# Patient Record
Sex: Female | Born: 1960
Health system: Southern US, Community
[De-identification: ages and names within clinical notes are randomized; demographics above are authoritative.]

## PROBLEM LIST (undated history)

## (undated) DIAGNOSIS — Z8619 Personal history of other infectious and parasitic diseases: Secondary | ICD-10-CM

## (undated) DIAGNOSIS — Z86010 Personal history of colonic polyps: Principal | ICD-10-CM

## (undated) DIAGNOSIS — I714 Abdominal aortic aneurysm, without rupture, unspecified: Secondary | ICD-10-CM

## (undated) DIAGNOSIS — Z806 Family history of leukemia: Secondary | ICD-10-CM

## (undated) DIAGNOSIS — M722 Plantar fascial fibromatosis: Secondary | ICD-10-CM

## (undated) DIAGNOSIS — T7840XA Allergy, unspecified, initial encounter: Secondary | ICD-10-CM

## (undated) DIAGNOSIS — J45909 Unspecified asthma, uncomplicated: Secondary | ICD-10-CM

## (undated) DIAGNOSIS — I10 Essential (primary) hypertension: Secondary | ICD-10-CM

## (undated) HISTORY — DX: Essential (primary) hypertension: I10

## (undated) HISTORY — DX: Unspecified asthma, uncomplicated: J45.909

## (undated) HISTORY — DX: Family history of leukemia: Z80.6

## (undated) HISTORY — DX: Personal history of colonic polyps: Z86.010

## (undated) HISTORY — PX: COLONOSCOPY: SHX174

## (undated) HISTORY — DX: Plantar fascial fibromatosis: M72.2

## (undated) HISTORY — PX: WISDOM TOOTH EXTRACTION: SHX21

## (undated) HISTORY — DX: Abdominal aortic aneurysm, without rupture, unspecified: I71.40

## (undated) HISTORY — DX: Personal history of other infectious and parasitic diseases: Z86.19

## (undated) HISTORY — DX: Allergy, unspecified, initial encounter: T78.40XA

---

## 2000-11-02 ENCOUNTER — Other Ambulatory Visit: Admission: RE | Admit: 2000-11-02 | Discharge: 2000-11-02 | Payer: Self-pay | Admitting: Gynecology

## 2001-11-03 ENCOUNTER — Other Ambulatory Visit: Admission: RE | Admit: 2001-11-03 | Discharge: 2001-11-03 | Payer: Self-pay | Admitting: Gynecology

## 2002-12-14 ENCOUNTER — Other Ambulatory Visit: Admission: RE | Admit: 2002-12-14 | Discharge: 2002-12-14 | Payer: Self-pay | Admitting: Gynecology

## 2004-01-28 ENCOUNTER — Other Ambulatory Visit: Admission: RE | Admit: 2004-01-28 | Discharge: 2004-01-28 | Payer: Self-pay | Admitting: Gynecology

## 2004-08-26 ENCOUNTER — Other Ambulatory Visit: Admission: RE | Admit: 2004-08-26 | Discharge: 2004-08-26 | Payer: Self-pay | Admitting: Gynecology

## 2005-04-09 ENCOUNTER — Other Ambulatory Visit: Admission: RE | Admit: 2005-04-09 | Discharge: 2005-04-09 | Payer: Self-pay | Admitting: Gynecology

## 2006-05-03 ENCOUNTER — Other Ambulatory Visit: Admission: RE | Admit: 2006-05-03 | Discharge: 2006-05-03 | Payer: Self-pay | Admitting: Gynecology

## 2006-11-23 ENCOUNTER — Other Ambulatory Visit: Admission: RE | Admit: 2006-11-23 | Discharge: 2006-11-23 | Payer: Self-pay | Admitting: Gynecology

## 2007-06-09 ENCOUNTER — Other Ambulatory Visit: Admission: RE | Admit: 2007-06-09 | Discharge: 2007-06-09 | Payer: Self-pay | Admitting: Gynecology

## 2010-09-10 ENCOUNTER — Emergency Department (HOSPITAL_COMMUNITY): Admission: EM | Admit: 2010-09-10 | Discharge: 2010-09-10 | Payer: Self-pay | Admitting: Emergency Medicine

## 2011-04-19 ENCOUNTER — Emergency Department (HOSPITAL_COMMUNITY): Payer: BC Managed Care – PPO

## 2011-04-19 ENCOUNTER — Emergency Department (HOSPITAL_COMMUNITY)
Admission: EM | Admit: 2011-04-19 | Discharge: 2011-04-19 | Disposition: A | Discharge status: Home / Self Care | Payer: BC Managed Care – PPO | Attending: Emergency Medicine | Admitting: Emergency Medicine

## 2011-04-19 DIAGNOSIS — S61409A Unspecified open wound of unspecified hand, initial encounter: Secondary | ICD-10-CM | POA: Insufficient documentation

## 2014-06-21 ENCOUNTER — Other Ambulatory Visit: Payer: Self-pay | Admitting: Emergency Medicine

## 2014-06-21 DIAGNOSIS — R945 Abnormal results of liver function studies: Principal | ICD-10-CM

## 2014-06-21 DIAGNOSIS — R7989 Other specified abnormal findings of blood chemistry: Secondary | ICD-10-CM

## 2014-06-28 ENCOUNTER — Other Ambulatory Visit: Payer: BC Managed Care – PPO

## 2014-07-06 ENCOUNTER — Ambulatory Visit
Admission: RE | Admit: 2014-07-06 | Discharge: 2014-07-06 | Disposition: A | Discharge status: Home / Self Care | Payer: BC Managed Care – PPO | Source: Ambulatory Visit | Attending: Emergency Medicine | Admitting: Emergency Medicine

## 2014-07-06 DIAGNOSIS — R7989 Other specified abnormal findings of blood chemistry: Secondary | ICD-10-CM

## 2014-07-06 DIAGNOSIS — R945 Abnormal results of liver function studies: Principal | ICD-10-CM

## 2015-09-10 ENCOUNTER — Encounter: Payer: Self-pay | Admitting: Internal Medicine

## 2015-11-05 ENCOUNTER — Ambulatory Visit (AMBULATORY_SURGERY_CENTER): Payer: Self-pay | Admitting: *Deleted

## 2015-11-05 VITALS — Ht 64.0 in | Wt 192.6 lb

## 2015-11-05 DIAGNOSIS — Z1211 Encounter for screening for malignant neoplasm of colon: Secondary | ICD-10-CM

## 2015-11-05 NOTE — Progress Notes (Signed)
No egg or soy allergy known to patient  No issues with past sedation with any surgeries  or procedures, no intubation problems  No diet pills No home 02 use per patient   emmi video declined

## 2015-11-22 ENCOUNTER — Encounter: Payer: Self-pay | Admitting: Internal Medicine

## 2015-11-22 ENCOUNTER — Ambulatory Visit (AMBULATORY_SURGERY_CENTER): Payer: BLUE CROSS/BLUE SHIELD | Admitting: Internal Medicine

## 2015-11-22 VITALS — BP 145/82 | HR 57 | Temp 97.9°F | Resp 14 | Ht 64.0 in | Wt 192.0 lb

## 2015-11-22 DIAGNOSIS — D128 Benign neoplasm of rectum: Secondary | ICD-10-CM

## 2015-11-22 DIAGNOSIS — D125 Benign neoplasm of sigmoid colon: Secondary | ICD-10-CM | POA: Diagnosis not present

## 2015-11-22 DIAGNOSIS — Z1211 Encounter for screening for malignant neoplasm of colon: Secondary | ICD-10-CM

## 2015-11-22 DIAGNOSIS — D129 Benign neoplasm of anus and anal canal: Secondary | ICD-10-CM

## 2015-11-22 MED ORDER — SODIUM CHLORIDE 0.9 % IV SOLN: 500.0000 mL | INTRAVENOUS | Status: DC

## 2015-11-22 NOTE — Progress Notes (Signed)
Stable to RR 

## 2015-11-22 NOTE — Op Note (Signed)
[facility]  [address] [place], [address]   COLONOSCOPY PROCEDURE REPORT  PATIENT: Tanya [name], Tanya [name]  MR#: [id number] BIRTHDATE: [birth year] , 74  yrs. old GENDER: female ENDOSCOPIST: Gatha Mayer, MD, [facility] PROCEDURE DATE:  11/22/2015 PROCEDURE:   Colonoscopy, screening and Colonoscopy with snare polypectomy First Screening Colonoscopy - Avg.  risk and is 50 yrs.  old or older Yes.  Prior Negative Screening - Now for repeat screening. N/A  History of Adenoma - Now for follow-up colonoscopy & has been > or = to 3 yrs.  N/A  Polyps removed today? Yes ASA CLASS:   Class I INDICATIONS:Screening for colonic neoplasia and Colorectal Neoplasm Risk Assessment for this procedure is average risk. MEDICATIONS: Propofol 300 mg IV, Monitored anesthesia care, and Lidocaine 40 mg IV  DESCRIPTION OF PROCEDURE:   After the risks benefits and alternatives of the procedure were thoroughly explained, informed consent was obtained.  The digital rectal exam revealed no abnormalities of the rectum.   The LB [id number]  endoscope was introduced through the anus and advanced to the cecum, which was identified by both the appendix and ileocecal valve. No adverse events experienced.   The quality of the prep was excellent. (MiraLax was used)  The instrument was then slowly withdrawn as the colon was fully examined. Estimated blood loss is zero unless otherwise noted in this procedure report.      COLON FINDINGS: A polypoid shaped semi-pedunculated polyp measuring 10 mm in size was found in the sigmoid colon.  A polypectomy was performed using snare cautery.  The resection was complete, the polyp tissue was completely retrieved and sent to histology. Retroflexed views revealed no abnormalities. The time to cecum = 1.5 Withdrawal time = 7.8   The scope was withdrawn and the procedure completed. COMPLICATIONS: There were no immediate complications.  ENDOSCOPIC  IMPRESSION: Semi-pedunculated polyp was found in the sigmoid colon; polypectomy was performed using snare cautery  RECOMMENDATIONS: 1.  Hold Aspirin and all other NSAIDS for 2 weeks. 2.  Timing of repeat colonoscopy will be determined by pathology findings.  eSigned:  Gatha Mayer, MD, [facility] 11/22/2015 8:47 AM   cc: The Patient and [facility]

## 2015-11-22 NOTE — Progress Notes (Signed)
Called to room to assist during endoscopic procedure.  Patient ID and intended procedure confirmed with present staff. Received instructions for my participation in the procedure from the performing physician.  

## 2015-11-22 NOTE — Patient Instructions (Addendum)
I found and removed one polyp - it looks benign. I will let you know pathology results and when to have another routine colonoscopy by mail.  I appreciate the opportunity to care for you. Gatha Mayer, MD, [facility]   Discharge instructions give. Handout on polyps. Hold Aspirin and all other NSAIDS for 2 weeks. Resume previous medications. YOU HAD AN ENDOSCOPIC PROCEDURE TODAY AT [facility]:   Refer to the procedure report that was given to you for any specific questions about what was found during the examination.  If the procedure report does not answer your questions, please call your gastroenterologist to clarify.  If you requested that your care partner not be given the details of your procedure findings, then the procedure report has been included in a sealed envelope for you to review at your convenience later.  YOU SHOULD EXPECT: Some feelings of bloating in the abdomen. Passage of more gas than usual.  Walking can help get rid of the air that was put into your GI tract during the procedure and reduce the bloating. If you had a lower endoscopy (such as a colonoscopy or flexible sigmoidoscopy) you may notice spotting of blood in your stool or on the toilet paper. If you underwent a bowel prep for your procedure, you may not have a normal bowel movement for a few days.  Please Note:  You might notice some irritation and congestion in your nose or some drainage.  This is from the oxygen used during your procedure.  There is no need for concern and it should clear up in a day or so.  SYMPTOMS TO REPORT IMMEDIATELY:   Following lower endoscopy (colonoscopy or flexible sigmoidoscopy):  Excessive amounts of blood in the stool  Significant tenderness or worsening of abdominal pains  Swelling of the abdomen that is new, acute  Fever of 100F or higher   For urgent or emergent issues, a gastroenterologist can be reached at any hour by calling [phone]  [phone].   DIET: Your first meal following the procedure should be a small meal and then it is ok to progress to your normal diet. Heavy or fried foods are harder to digest and may make you feel nauseous or bloated.  Likewise, meals heavy in dairy and vegetables can increase bloating.  Drink plenty of fluids but you should avoid alcoholic beverages for 24 hours.  ACTIVITY:  You should plan to take it easy for the rest of today and you should NOT DRIVE or use heavy machinery until tomorrow (because of the sedation medicines used during the test).    FOLLOW UP: Our staff will call the number listed on your records the next business day following your procedure to check on you and address any questions or concerns that you may have regarding the information given to you following your procedure. If we do not reach you, we will leave a message.  However, if you are feeling well and you are not experiencing any problems, there is no need to return our call.  We will assume that you have returned to your regular daily activities without incident.  If any biopsies were taken you will be contacted by phone or by letter within the next 1-3 weeks.  Please call us at [phone] if you have not heard about the biopsies in 3 weeks.    SIGNATURES/CONFIDENTIALITY: You and/or your care partner have signed paperwork which will be entered into your electronic medical record.  These signatures  attest to the fact that that the information above on your After Visit Summary has been reviewed and is understood.  Full responsibility of the confidentiality of this discharge information lies with you and/or your care-partner.

## 2015-11-26 ENCOUNTER — Telehealth: Payer: Self-pay | Admitting: *Deleted

## 2015-11-26 NOTE — Telephone Encounter (Signed)
  Follow up Call-  Call back number 11/22/2015  Post procedure Call Back phone  # [phone]  Permission to leave phone message Yes    Pt driving, spoke with husband Patient questions:  Do you have a fever, pain , or abdominal swelling? No. Pain Score  0 *  Have you tolerated food without any problems? Yes.    Have you been able to return to your normal activities? Yes.    Do you have any questions about your discharge instructions: Diet   No. Medications  No. Follow up visit  No.  Do you have questions or concerns about your Care? No.  Actions: * If pain score is 4 or above: No action needed, pain <4.

## 2015-11-27 ENCOUNTER — Encounter: Payer: Self-pay | Admitting: Internal Medicine

## 2015-11-27 DIAGNOSIS — Z86010 Personal history of colonic polyps: Secondary | ICD-10-CM

## 2015-11-27 DIAGNOSIS — Z860101 Personal history of adenomatous and serrated colon polyps: Secondary | ICD-10-CM | POA: Insufficient documentation

## 2015-11-27 HISTORY — DX: Personal history of colonic polyps: Z86.010

## 2015-11-27 HISTORY — DX: Personal history of adenomatous and serrated colon polyps: Z86.0101

## 2015-11-27 NOTE — Progress Notes (Signed)
Quick Note:  10 mm adenoma Recall colonoscopy 2020 ______

## 2016-11-25 DIAGNOSIS — H524 Presbyopia: Secondary | ICD-10-CM | POA: Diagnosis not present

## 2017-08-24 ENCOUNTER — Encounter: Payer: Self-pay | Admitting: Physician Assistant

## 2017-08-24 ENCOUNTER — Ambulatory Visit (INDEPENDENT_AMBULATORY_CARE_PROVIDER_SITE_OTHER): Payer: BLUE CROSS/BLUE SHIELD | Admitting: Physician Assistant

## 2017-08-24 VITALS — BP 142/90 | HR 59 | Temp 98.3°F | Resp 14 | Ht 64.0 in | Wt 197.0 lb

## 2017-08-24 DIAGNOSIS — Z0001 Encounter for general adult medical examination with abnormal findings: Secondary | ICD-10-CM

## 2017-08-24 DIAGNOSIS — Z1159 Encounter for screening for other viral diseases: Secondary | ICD-10-CM | POA: Diagnosis not present

## 2017-08-24 DIAGNOSIS — Z0000 Encounter for general adult medical examination without abnormal findings: Secondary | ICD-10-CM

## 2017-08-24 DIAGNOSIS — N951 Menopausal and female climacteric states: Secondary | ICD-10-CM | POA: Insufficient documentation

## 2017-08-24 DIAGNOSIS — R030 Elevated blood-pressure reading, without diagnosis of hypertension: Secondary | ICD-10-CM

## 2017-08-24 DIAGNOSIS — Z6833 Body mass index (BMI) 33.0-33.9, adult: Secondary | ICD-10-CM | POA: Insufficient documentation

## 2017-08-24 DIAGNOSIS — I10 Essential (primary) hypertension: Secondary | ICD-10-CM | POA: Insufficient documentation

## 2017-08-24 DIAGNOSIS — E669 Obesity, unspecified: Secondary | ICD-10-CM | POA: Insufficient documentation

## 2017-08-24 DIAGNOSIS — R87619 Unspecified abnormal cytological findings in specimens from cervix uteri: Secondary | ICD-10-CM | POA: Insufficient documentation

## 2017-08-24 LAB — COMPREHENSIVE METABOLIC PANEL
ALK PHOS: 73 U/L (ref 39–117)
ALT: 63 U/L — AB (ref 0–35)
AST: 31 U/L (ref 0–37)
Albumin: 4.6 g/dL (ref 3.5–5.2)
BILIRUBIN TOTAL: 0.6 mg/dL (ref 0.2–1.2)
BUN: 21 mg/dL (ref 6–23)
CALCIUM: 10.2 mg/dL (ref 8.4–10.5)
CO2: 28 mEq/L (ref 19–32)
Chloride: 101 mEq/L (ref 96–112)
Creatinine, Ser: 0.86 mg/dL (ref 0.40–1.20)
GFR: 72.63 mL/min (ref >60.00)
Glucose, Bld: 123 mg/dL — ABNORMAL HIGH (ref 70–99)
Potassium: 4.2 mEq/L (ref 3.5–5.1)
Sodium: 137 mEq/L (ref 135–145)
TOTAL PROTEIN: 7.1 g/dL (ref 6.0–8.3)

## 2017-08-24 LAB — LIPID PANEL
CHOLESTEROL: 231 mg/dL — AB (ref 0–200)
HDL: 65.8 mg/dL (ref >39.00)
NonHDL: 164.96
TRIGLYCERIDES: 216 mg/dL — AB (ref 0.0–149.0)
Total CHOL/HDL Ratio: 4
VLDL: 43.2 mg/dL — ABNORMAL HIGH (ref 0.0–40.0)

## 2017-08-24 LAB — CBC WITH DIFFERENTIAL/PLATELET
BASOS ABS: 0 10*3/uL (ref 0.0–0.1)
Basophils Relative: 0.6 % (ref 0.0–3.0)
Eosinophils Absolute: 0 10*3/uL (ref 0.0–0.7)
Eosinophils Relative: 0.3 % (ref 0.0–5.0)
HEMATOCRIT: 42.1 % (ref 36.0–46.0)
Hemoglobin: 14.2 g/dL (ref 12.0–15.0)
LYMPHS PCT: 32.7 % (ref 12.0–46.0)
Lymphs Abs: 1.6 10*3/uL (ref 0.7–4.0)
MCHC: 33.7 g/dL (ref 30.0–36.0)
MCV: 92.8 fl (ref 78.0–100.0)
MONOS PCT: 8.8 % (ref 3.0–12.0)
Monocytes Absolute: 0.4 10*3/uL (ref 0.1–1.0)
NEUTROS ABS: 2.8 10*3/uL (ref 1.4–7.7)
Neutrophils Relative %: 57.6 % (ref 43.0–77.0)
PLATELETS: 242 10*3/uL (ref 150.0–400.0)
RBC: 4.54 Mil/uL (ref 3.87–5.11)
RDW: 12.9 % (ref 11.5–15.5)
WBC: 4.8 10*3/uL (ref 4.0–10.5)

## 2017-08-24 LAB — LDL CHOLESTEROL, DIRECT: Direct LDL: 138 mg/dL

## 2017-08-24 LAB — HEMOGLOBIN A1C: Hgb A1c MFr Bld: 6.1 % (ref 4.6–6.5)

## 2017-08-24 NOTE — Assessment & Plan Note (Signed)
Depression screen negative. Health Maintenance reviewed -- Declines flu shot. Is unsure of Tetanus. Will check records. Mammogram scheduled. PAP and colonoscopy up-to-date. Preventive schedule discussed and handout given in AVS. Will obtain fasting labs today.

## 2017-08-24 NOTE — Patient Instructions (Signed)
Please go to the lab for blood work.   Our office will call you with your results unless you have chosen to receive results via MyChart.  If your blood work is normal we will follow-up each year for physicals and as scheduled for chronic medical problems.  If anything is abnormal we will treat accordingly and get you in for a follow-up.  Please start the diet below to help with BP. Follow-up in 1 month for BP recheck with me.   DASH Eating Plan DASH stands for "Dietary Approaches to Stop Hypertension." The DASH eating plan is a healthy eating plan that has been shown to reduce high blood pressure (hypertension). It may also reduce your risk for type 2 diabetes, heart disease, and stroke. The DASH eating plan may also help with weight loss. What are tips for following this plan? General guidelines  Avoid eating more than 2,300 mg (milligrams) of salt (sodium) a day. If you have hypertension, you may need to reduce your sodium intake to 1,500 mg a day.  Limit alcohol intake to no more than 1 drink a day for nonpregnant women and 2 drinks a day for men. One drink equals 12 oz of beer, 5 oz of wine, or 1 oz of hard liquor.  Work with your health care provider to maintain a healthy body weight or to lose weight. Ask what an ideal weight is for you.  Get at least 30 minutes of exercise that causes your heart to beat faster (aerobic exercise) most days of the week. Activities may include walking, swimming, or biking.  Work with your health care provider or diet and nutrition specialist (dietitian) to adjust your eating plan to your individual calorie needs. Reading food labels  Check food labels for the amount of sodium per serving. Choose foods with less than 5 percent of the Daily Value of sodium. Generally, foods with less than 300 mg of sodium per serving fit into this eating plan.  To find whole grains, look for the word "whole" as the first word in the ingredient list. Shopping  Buy  products labeled as "low-sodium" or "no salt added."  Buy fresh foods. Avoid canned foods and premade or frozen meals. Cooking  Avoid adding salt when cooking. Use salt-free seasonings or herbs instead of table salt or sea salt. Check with your health care provider or pharmacist before using salt substitutes.  Do not fry foods. Cook foods using healthy methods such as baking, boiling, grilling, and broiling instead.  Cook with heart-healthy oils, such as olive, canola, soybean, or sunflower oil. Meal planning   Eat a balanced diet that includes: ? 5 or more servings of fruits and vegetables each day. At each meal, try to fill half of your plate with fruits and vegetables. ? Up to 6-8 servings of whole grains each day. ? Less than 6 oz of lean meat, poultry, or fish each day. A 3-oz serving of meat is about the same size as a deck of cards. One egg equals 1 oz. ? 2 servings of low-fat dairy each day. ? A serving of nuts, seeds, or beans 5 times each week. ? Heart-healthy fats. Healthy fats called Omega-3 fatty acids are found in foods such as flaxseeds and coldwater fish, like sardines, salmon, and mackerel.  Limit how much you eat of the following: ? Canned or prepackaged foods. ? Food that is high in trans fat, such as fried foods. ? Food that is high in saturated fat, such as fatty  meat. ? Sweets, desserts, sugary drinks, and other foods with added sugar. ? Full-fat dairy products.  Do not salt foods before eating.  Try to eat at least 2 vegetarian meals each week.  Eat more home-cooked food and less restaurant, buffet, and fast food.  When eating at a restaurant, ask that your food be prepared with less salt or no salt, if possible. What foods are recommended? The items listed may not be a complete list. Talk with your dietitian about what dietary choices are best for you. Grains Whole-grain or whole-wheat bread. Whole-grain or whole-wheat pasta. Brown rice. [place].  Bulgur. Whole-grain and low-sodium cereals. Pita bread. Low-fat, low-sodium crackers. Whole-wheat flour tortillas. Vegetables Fresh or frozen vegetables (raw, steamed, roasted, or grilled). Low-sodium or reduced-sodium tomato and vegetable juice. Low-sodium or reduced-sodium tomato sauce and tomato paste. Low-sodium or reduced-sodium canned vegetables. Fruits All fresh, dried, or frozen fruit. Canned fruit in natural juice (without added sugar). Meat and other protein foods Skinless chicken or [place]. Ground chicken or [place]. Pork with fat trimmed off. Fish and seafood. Egg whites. Dried beans, peas, or lentils. Unsalted nuts, nut butters, and seeds. Unsalted canned beans. Lean cuts of beef with fat trimmed off. Low-sodium, lean deli meat. Dairy Low-fat (1%) or fat-free (skim) milk. Fat-free, low-fat, or reduced-fat cheeses. Nonfat, low-sodium ricotta or cottage cheese. Low-fat or nonfat yogurt. Low-fat, low-sodium cheese. Fats and oils Soft margarine without trans fats. Vegetable oil. Low-fat, reduced-fat, or light mayonnaise and salad dressings (reduced-sodium). Canola, safflower, olive, soybean, and sunflower oils. Avocado. Seasoning and other foods Herbs. Spices. Seasoning mixes without salt. Unsalted popcorn and pretzels. Fat-free sweets. What foods are not recommended? The items listed may not be a complete list. Talk with your dietitian about what dietary choices are best for you. Grains Baked goods made with fat, such as croissants, muffins, or some breads. Dry pasta or rice meal packs. Vegetables Creamed or fried vegetables. Vegetables in a cheese sauce. Regular canned vegetables (not low-sodium or reduced-sodium). Regular canned tomato sauce and paste (not low-sodium or reduced-sodium). Regular tomato and vegetable juice (not low-sodium or reduced-sodium). Angie Fava. Olives. Fruits Canned fruit in a light or heavy syrup. Fried fruit. Fruit in cream or butter sauce. Meat and other  protein foods Fatty cuts of meat. Ribs. Fried meat. Berniece Salines. Sausage. Bologna and other processed lunch meats. Salami. Fatback. Hotdogs. Bratwurst. Salted nuts and seeds. Canned beans with added salt. Canned or smoked fish. Whole eggs or egg yolks. Chicken or [place] with skin. Dairy Whole or 2% milk, cream, and half-and-half. Whole or full-fat cream cheese. Whole-fat or sweetened yogurt. Full-fat cheese. Nondairy creamers. Whipped toppings. Processed cheese and cheese spreads. Fats and oils Butter. Stick margarine. Lard. Shortening. Ghee. Bacon fat. Tropical oils, such as coconut, palm kernel, or palm oil. Seasoning and other foods Salted popcorn and pretzels. Onion salt, garlic salt, seasoned salt, table salt, and sea salt. Worcestershire sauce. Tartar sauce. Barbecue sauce. Teriyaki sauce. Soy sauce, including reduced-sodium. Steak sauce. Canned and packaged gravies. Fish sauce. Oyster sauce. Cocktail sauce. Horseradish that you find on the shelf. Ketchup. Mustard. Meat flavorings and tenderizers. Bouillon cubes. Hot sauce and Tabasco sauce. Premade or packaged marinades. Premade or packaged taco seasonings. Relishes. Regular salad dressings. Where to find more information:  National Heart, Lung, and [place]: https://wilson-eaton.com/  American Heart Association: www.heart.org Summary  The DASH eating plan is a healthy eating plan that has been shown to reduce high blood pressure (hypertension). It may also reduce your risk for type  2 diabetes, heart disease, and stroke.  With the DASH eating plan, you should limit salt (sodium) intake to 2,300 mg a day. If you have hypertension, you may need to reduce your sodium intake to 1,500 mg a day.  When on the DASH eating plan, aim to eat more fresh fruits and vegetables, whole grains, lean proteins, low-fat dairy, and heart-healthy fats.  Work with your health care provider or diet and nutrition specialist (dietitian) to adjust your eating plan to your  individual calorie needs. This information is not intended to replace advice given to you by your health care provider. Make sure you discuss any questions you have with your health care provider. Document Released: 10/22/2011 Document Revised: 10/26/2016 Document Reviewed: 10/26/2016 Elsevier Interactive Patient Education  2017 [place] Years, Female Preventive care refers to lifestyle choices and visits with your health care provider that can promote health and wellness. What does preventive care include?  A yearly physical exam. This is also called an annual well check.  Dental exams once or twice a year.  Routine eye exams. Ask your health care provider how often you should have your eyes checked.  Personal lifestyle choices, including: ? Daily care of your teeth and gums. ? Regular physical activity. ? Eating a healthy diet. ? Avoiding tobacco and drug use. ? Limiting alcohol use. ? Practicing safe sex. ? Taking low-dose aspirin daily starting at age 16. ? Taking vitamin and mineral supplements as recommended by your health care provider. What happens during an annual well check? The services and screenings done by your health care provider during your annual well check will depend on your age, overall health, lifestyle risk factors, and family history of disease. Counseling Your health care provider may ask you questions about your:  Alcohol use.  Tobacco use.  Drug use.  Emotional well-being.  Home and relationship well-being.  Sexual activity.  Eating habits.  Work and work Statistician.  Method of birth control.  Menstrual cycle.  Pregnancy history.  Screening You may have the following tests or measurements:  Height, weight, and BMI.  Blood pressure.  Lipid and cholesterol levels. These may be checked every 5 years, or more frequently if you are over 31 years old.  Skin check.  Lung cancer screening. You may have  this screening every year starting at age 56 if you have a 30-pack-year history of smoking and currently smoke or have quit within the past 15 years.  Fecal occult blood test (FOBT) of the stool. You may have this test every year starting at age 22.  Flexible sigmoidoscopy or colonoscopy. You may have a sigmoidoscopy every 5 years or a colonoscopy every 10 years starting at age 33.  Hepatitis C blood test.  Hepatitis B blood test.  Sexually transmitted disease (STD) testing.  Diabetes screening. This is done by checking your blood sugar (glucose) after you have not eaten for a while (fasting). You may have this done every 1-3 years.  Mammogram. This may be done every 1-2 years. Talk to your health care provider about when you should start having regular mammograms. This may depend on whether you have a family history of breast cancer.  BRCA-related cancer screening. This may be done if you have a family history of breast, ovarian, tubal, or peritoneal cancers.  Pelvic exam and Pap test. This may be done every 3 years starting at age 54. Starting at age 17, this may be done every 5 years  if you have a Pap test in combination with an HPV test.  Bone density scan. This is done to screen for osteoporosis. You may have this scan if you are at high risk for osteoporosis.  Discuss your test results, treatment options, and if necessary, the need for more tests with your health care provider. Vaccines Your health care provider may recommend certain vaccines, such as:  Influenza vaccine. This is recommended every year.  Tetanus, diphtheria, and acellular pertussis (Tdap, Td) vaccine. You may need a Td booster every 10 years.  Varicella vaccine. You may need this if you have not been vaccinated.  Zoster vaccine. You may need this after age 18.  Measles, mumps, and rubella (MMR) vaccine. You may need at least one dose of MMR if you were born in 1957 or later. You may also need a second  dose.  Pneumococcal 13-valent conjugate (PCV13) vaccine. You may need this if you have certain conditions and were not previously vaccinated.  Pneumococcal polysaccharide (PPSV23) vaccine. You may need one or two doses if you smoke cigarettes or if you have certain conditions.  Meningococcal vaccine. You may need this if you have certain conditions.  Hepatitis A vaccine. You may need this if you have certain conditions or if you travel or work in places where you may be exposed to hepatitis A.  Hepatitis B vaccine. You may need this if you have certain conditions or if you travel or work in places where you may be exposed to hepatitis B.  Haemophilus influenzae type b (Hib) vaccine. You may need this if you have certain conditions.  Talk to your health care provider about which screenings and vaccines you need and how often you need them. This information is not intended to replace advice given to you by your health care provider. Make sure you discuss any questions you have with your health care provider. Document Released: 11/29/2015 Document Revised: 07/22/2016 Document Reviewed: 09/03/2015 Elsevier Interactive Patient Education  2017 [employer].

## 2017-08-24 NOTE — Progress Notes (Signed)
Patient presents to clinic today to establish care. Patient retired from [employer] in 2017. Endorses well-balanced diet overall. Stays active - plays tennis, cycles and does yoga.  Acute Concerns: Denies acute concerns today.  Chronic Issues: Hyperlipidemia -- prior diagnosis per patient. Was placed on simvastatin but could not tolerate due to joint aches. Has not had labs in quite some time. Does take an 81 mg ASA daily.  Health Maintenance: Immunizations -- Has not had flu shot. Unsure of last Tetanus. Feels it has been in the past few years.  Colonoscopy -- Up-to-date. Noted 10 mm adenoma. Recall 2020. Mammogram -- Last Mammogram 2017 at [facility]. Has repeat scheduled next week. PAP -- Followed by GYN. Has history of abnormal PAP. Last PAP 2016. Has follow-up in December.   Past Medical History:  Diagnosis Date  . History of chickenpox   . Hx of adenomatous polyp of colon 11/27/2015  . Plantar fasciitis   . Vaginal tear resulting from childbirth    occ will bleed after large bm with this     Past Surgical History:  Procedure Laterality Date  . CESAREAN SECTION  1997  . VAGINAL DELIVERY     x2  . WISDOM TOOTH EXTRACTION      Current Outpatient Prescriptions on File Prior to Visit  Medication Sig Dispense Refill  . aspirin EC 81 MG tablet Take 81 mg by mouth daily.    . cholecalciferol (VITAMIN D) 1000 UNITS tablet Take 1,000 Units by mouth daily.    . Multiple Vitamin (MULTIVITAMIN) tablet Take 1 tablet by mouth daily.     No current facility-administered medications on file prior to visit.     No Known Allergies  Family History  Problem Relation Age of Onset  . Diabetes Maternal Grandmother   . Colon cancer Neg Hx   . Colon polyps Neg Hx   . Esophageal cancer Neg Hx   . Rectal cancer Neg Hx   . Stomach cancer Neg Hx     Social History   Social History  . Marital status: Married    Spouse name: N/A  . Number of children: N/A  . Years of education: N/A    Occupational History  . Not on file.   Social History Main Topics  . Smoking status: Never Smoker  . Smokeless tobacco: Never Used  . Alcohol use 1.2 oz/week    2 Glasses of wine per week     Comment: wine socially  . Drug use: No  . Sexual activity: Yes    Birth control/ protection: Post-menopausal   Other Topics Concern  . Not on file   Social History Narrative  . No narrative on file   Review of Systems  Constitutional: Negative for fever and weight loss.  HENT: Negative for ear discharge, ear pain, hearing loss and tinnitus.   Eyes: Negative for blurred vision, double vision, photophobia and pain.  Respiratory: Negative for cough and shortness of breath.   Cardiovascular: Negative for chest pain and palpitations.  Gastrointestinal: Negative for abdominal pain, blood in stool, constipation, diarrhea, heartburn, melena, nausea and vomiting.  Genitourinary: Negative for dysuria, flank pain, frequency, hematuria and urgency.  Musculoskeletal: Negative for falls.  Neurological: Negative for dizziness, loss of consciousness and headaches.  Endo/Heme/Allergies: Negative for environmental allergies.  Psychiatric/Behavioral: Negative for depression, hallucinations, substance abuse and suicidal ideas. The patient is not nervous/anxious and does not have insomnia.    BP (!) 142/90   Pulse (!) 59   Temp 98.3  F (36.8 C) (Oral)   Resp 14   Ht 5\' 4"  (1.626 m)   Wt 197 lb (89.4 kg)   LMP 08/25/2015 (Approximate)   SpO2 98%   BMI 33.81 kg/m   Physical Exam  Constitutional: She is oriented to person, place, and time and well-developed, well-nourished, and in no distress.  HENT:  Head: Normocephalic and atraumatic.  Right Ear: Tympanic membrane, external ear and ear canal normal.  Left Ear: Tympanic membrane, external ear and ear canal normal.  Nose: Nose normal. No mucosal edema.  Mouth/Throat: Uvula is midline, oropharynx is clear and moist and mucous membranes are normal.  No oropharyngeal exudate or posterior oropharyngeal erythema.  Eyes: Pupils are equal, round, and reactive to light. Conjunctivae are normal.  Neck: Neck supple. No thyromegaly present.  Cardiovascular: Normal rate, regular rhythm, normal heart sounds and intact distal pulses.   Pulmonary/Chest: Effort normal and breath sounds normal. No respiratory distress. She has no wheezes. She has no rales.  Abdominal: Soft. Bowel sounds are normal. She exhibits no distension and no mass. There is no tenderness. There is no rebound and no guarding.  Lymphadenopathy:    She has no cervical adenopathy.  Neurological: She is alert and oriented to person, place, and time. No cranial nerve deficit.  Skin: Skin is warm and dry. No rash noted.  Psychiatric: Affect normal.  Vitals reviewed.  Assessment/Plan: Visit for preventive health examination Depression screen negative. Health Maintenance reviewed -- Declines flu shot. Is unsure of Tetanus. Will check records. Mammogram scheduled. PAP and colonoscopy up-to-date. Preventive schedule discussed and handout given in AVS. Will obtain fasting labs today.   Need for hepatitis C screening test Lab ordered today.  Elevated BP without diagnosis of hypertension Improved on recheck. Start DASH diet. Will repeat at follow-up visit.    Leeanne Rio, PA-C

## 2017-08-24 NOTE — Assessment & Plan Note (Signed)
Improved on recheck. Start DASH diet. Will repeat at follow-up visit.

## 2017-08-24 NOTE — Progress Notes (Signed)
Pre visit review using our clinic review tool, if applicable. No additional management support is needed unless otherwise documented below in the visit note. 

## 2017-08-24 NOTE — Assessment & Plan Note (Signed)
Lab ordered today 

## 2017-08-25 LAB — HEPATITIS C ANTIBODY
Hepatitis C Ab: NONREACTIVE
SIGNAL TO CUT-OFF: 0 (ref <1.00)

## 2017-09-01 DIAGNOSIS — Z1231 Encounter for screening mammogram for malignant neoplasm of breast: Secondary | ICD-10-CM | POA: Diagnosis not present

## 2017-09-01 LAB — HM MAMMOGRAPHY

## 2017-09-02 ENCOUNTER — Encounter: Payer: Self-pay | Admitting: Emergency Medicine

## 2017-09-21 ENCOUNTER — Ambulatory Visit: Payer: BLUE CROSS/BLUE SHIELD | Admitting: Physician Assistant

## 2017-09-22 ENCOUNTER — Encounter: Payer: Self-pay | Admitting: Physician Assistant

## 2017-09-22 ENCOUNTER — Ambulatory Visit: Payer: BLUE CROSS/BLUE SHIELD | Admitting: Physician Assistant

## 2017-09-22 VITALS — BP 140/98 | HR 62 | Temp 98.6°F | Resp 14 | Ht 64.0 in | Wt 195.0 lb

## 2017-09-22 DIAGNOSIS — I10 Essential (primary) hypertension: Secondary | ICD-10-CM

## 2017-09-22 MED ORDER — LOSARTAN POTASSIUM 25 MG PO TABS: 25.0000 mg | ORAL_TABLET | Freq: Every day | ORAL | 0 refills | Status: DC

## 2017-09-22 NOTE — Assessment & Plan Note (Signed)
BP still elevated. Encouraged her to follow dietary and exercise regimen given. Will start low-dose losartan 25 mg daily. Follow-up 2-3 weeks for reassessment and to check BMP.

## 2017-09-22 NOTE — Progress Notes (Signed)
Pre visit review using our clinic review tool, if applicable. No additional management support is needed unless otherwise documented below in the visit note. 

## 2017-09-22 NOTE — Patient Instructions (Signed)
Please start losartan once daily as directed. Follow the dietary recommendations below. Increase aerobic exercise weekly to a goal of 150 minutes per week.  Follow-up 3-4 weeks for reassessment.  Return sooner if needed.   DASH Eating Plan DASH stands for "Dietary Approaches to Stop Hypertension." The DASH eating plan is a healthy eating plan that has been shown to reduce high blood pressure (hypertension). It may also reduce your risk for type 2 diabetes, heart disease, and stroke. The DASH eating plan may also help with weight loss. What are tips for following this plan? General guidelines  Avoid eating more than 2,300 mg (milligrams) of salt (sodium) a day. If you have hypertension, you may need to reduce your sodium intake to 1,500 mg a day.  Limit alcohol intake to no more than 1 drink a day for nonpregnant women and 2 drinks a day for men. One drink equals 12 oz of beer, 5 oz of wine, or 1 oz of hard liquor.  Work with your health care provider to maintain a healthy body weight or to lose weight. Ask what an ideal weight is for you.  Get at least 30 minutes of exercise that causes your heart to beat faster (aerobic exercise) most days of the week. Activities may include walking, swimming, or biking.  Work with your health care provider or diet and nutrition specialist (dietitian) to adjust your eating plan to your individual calorie needs. Reading food labels  Check food labels for the amount of sodium per serving. Choose foods with less than 5 percent of the Daily Value of sodium. Generally, foods with less than 300 mg of sodium per serving fit into this eating plan.  To find whole grains, look for the word "whole" as the first word in the ingredient list. Shopping  Buy products labeled as "low-sodium" or "no salt added."  Buy fresh foods. Avoid canned foods and premade or frozen meals. Cooking  Avoid adding salt when cooking. Use salt-free seasonings or herbs instead of  table salt or sea salt. Check with your health care provider or pharmacist before using salt substitutes.  Do not fry foods. Cook foods using healthy methods such as baking, boiling, grilling, and broiling instead.  Cook with heart-healthy oils, such as olive, canola, soybean, or sunflower oil. Meal planning   Eat a balanced diet that includes: ? 5 or more servings of fruits and vegetables each day. At each meal, try to fill half of your plate with fruits and vegetables. ? Up to 6-8 servings of whole grains each day. ? Less than 6 oz of lean meat, poultry, or fish each day. A 3-oz serving of meat is about the same size as a deck of cards. One egg equals 1 oz. ? 2 servings of low-fat dairy each day. ? A serving of nuts, seeds, or beans 5 times each week. ? Heart-healthy fats. Healthy fats called Omega-3 fatty acids are found in foods such as flaxseeds and coldwater fish, like sardines, salmon, and mackerel.  Limit how much you eat of the following: ? Canned or prepackaged foods. ? Food that is high in trans fat, such as fried foods. ? Food that is high in saturated fat, such as fatty meat. ? Sweets, desserts, sugary drinks, and other foods with added sugar. ? Full-fat dairy products.  Do not salt foods before eating.  Try to eat at least 2 vegetarian meals each week.  Eat more home-cooked food and less restaurant, buffet, and fast food.  When  eating at a restaurant, ask that your food be prepared with less salt or no salt, if possible. What foods are recommended? The items listed may not be a complete list. Talk with your dietitian about what dietary choices are best for you. Grains Whole-grain or whole-wheat bread. Whole-grain or whole-wheat pasta. Brown rice. [place]. Bulgur. Whole-grain and low-sodium cereals. Pita bread. Low-fat, low-sodium crackers. Whole-wheat flour tortillas. Vegetables Fresh or frozen vegetables (raw, steamed, roasted, or grilled). Low-sodium or  reduced-sodium tomato and vegetable juice. Low-sodium or reduced-sodium tomato sauce and tomato paste. Low-sodium or reduced-sodium canned vegetables. Fruits All fresh, dried, or frozen fruit. Canned fruit in natural juice (without added sugar). Meat and other protein foods Skinless chicken or [place]. Ground chicken or [place]. Pork with fat trimmed off. Fish and seafood. Egg whites. Dried beans, peas, or lentils. Unsalted nuts, nut butters, and seeds. Unsalted canned beans. Lean cuts of beef with fat trimmed off. Low-sodium, lean deli meat. Dairy Low-fat (1%) or fat-free (skim) milk. Fat-free, low-fat, or reduced-fat cheeses. Nonfat, low-sodium ricotta or cottage cheese. Low-fat or nonfat yogurt. Low-fat, low-sodium cheese. Fats and oils Soft margarine without trans fats. Vegetable oil. Low-fat, reduced-fat, or light mayonnaise and salad dressings (reduced-sodium). Canola, safflower, olive, soybean, and sunflower oils. Avocado. Seasoning and other foods Herbs. Spices. Seasoning mixes without salt. Unsalted popcorn and pretzels. Fat-free sweets. What foods are not recommended? The items listed may not be a complete list. Talk with your dietitian about what dietary choices are best for you. Grains Baked goods made with fat, such as croissants, muffins, or some breads. Dry pasta or rice meal packs. Vegetables Creamed or fried vegetables. Vegetables in a cheese sauce. Regular canned vegetables (not low-sodium or reduced-sodium). Regular canned tomato sauce and paste (not low-sodium or reduced-sodium). Regular tomato and vegetable juice (not low-sodium or reduced-sodium). Angie Fava. Olives. Fruits Canned fruit in a light or heavy syrup. Fried fruit. Fruit in cream or butter sauce. Meat and other protein foods Fatty cuts of meat. Ribs. Fried meat. Berniece Salines. Sausage. Bologna and other processed lunch meats. Salami. Fatback. Hotdogs. Bratwurst. Salted nuts and seeds. Canned beans with added salt. Canned or  smoked fish. Whole eggs or egg yolks. Chicken or [place] with skin. Dairy Whole or 2% milk, cream, and half-and-half. Whole or full-fat cream cheese. Whole-fat or sweetened yogurt. Full-fat cheese. Nondairy creamers. Whipped toppings. Processed cheese and cheese spreads. Fats and oils Butter. Stick margarine. Lard. Shortening. Ghee. Bacon fat. Tropical oils, such as coconut, palm kernel, or palm oil. Seasoning and other foods Salted popcorn and pretzels. Onion salt, garlic salt, seasoned salt, table salt, and sea salt. Worcestershire sauce. Tartar sauce. Barbecue sauce. Teriyaki sauce. Soy sauce, including reduced-sodium. Steak sauce. Canned and packaged gravies. Fish sauce. Oyster sauce. Cocktail sauce. Horseradish that you find on the shelf. Ketchup. Mustard. Meat flavorings and tenderizers. Bouillon cubes. Hot sauce and Tabasco sauce. Premade or packaged marinades. Premade or packaged taco seasonings. Relishes. Regular salad dressings. Where to find more information:  National Heart, Lung, and [place]: https://wilson-eaton.com/  American Heart Association: www.heart.org Summary  The DASH eating plan is a healthy eating plan that has been shown to reduce high blood pressure (hypertension). It may also reduce your risk for type 2 diabetes, heart disease, and stroke.  With the DASH eating plan, you should limit salt (sodium) intake to 2,300 mg a day. If you have hypertension, you may need to reduce your sodium intake to 1,500 mg a day.  When on the DASH eating plan, aim  to eat more fresh fruits and vegetables, whole grains, lean proteins, low-fat dairy, and heart-healthy fats.  Work with your health care provider or diet and nutrition specialist (dietitian) to adjust your eating plan to your individual calorie needs. This information is not intended to replace advice given to you by your health care provider. Make sure you discuss any questions you have with your health care provider. Document  Released: 10/22/2011 Document Revised: 10/26/2016 Document Reviewed: 10/26/2016 Elsevier Interactive Patient Education  2017 [employer].

## 2017-09-22 NOTE — Progress Notes (Signed)
Patient presents to clinic today for follow-up of elevated BP. Patient has not been checking BP at home as she has been traveling a lot. Has not made any changes to diet or exercise. Patient denies chest pain, palpitations, lightheadedness, dizziness, vision changes or frequent headaches.  BP Readings from Last 3 Encounters:  09/22/17 (!) 140/98  08/24/17 (!) 142/90  11/22/15 (!) 145/82   Past Medical History:  Diagnosis Date  . History of chickenpox   . Hx of adenomatous polyp of colon 11/27/2015  . Plantar fasciitis   . Vaginal tear resulting from childbirth    occ will bleed after large bm with this     Current Outpatient Medications on File Prior to Visit  Medication Sig Dispense Refill  . aspirin EC 81 MG tablet Take 81 mg by mouth daily.    . cholecalciferol (VITAMIN D) 1000 UNITS tablet Take 1,000 Units by mouth daily.    . Multiple Vitamin (MULTIVITAMIN) tablet Take 1 tablet by mouth daily.    . Nutritional Supplements (JUICE PLUS FIBRE PO) Take 1 tablet by mouth daily.    . Omega-3 Fatty Acids (FISH OIL) 1200 MG CAPS Take 1 capsule daily by mouth.     No current facility-administered medications on file prior to visit.     No Known Allergies  Family History  Problem Relation Age of Onset  . Diabetes Maternal Grandmother   . Colon cancer Neg Hx   . Colon polyps Neg Hx   . Esophageal cancer Neg Hx   . Rectal cancer Neg Hx   . Stomach cancer Neg Hx     Social History   Socioeconomic History  . Marital status: Married    Spouse name: None  . Number of children: None  . Years of education: None  . Highest education level: None  Social Needs  . Financial resource strain: None  . Food insecurity - worry: None  . Food insecurity - inability: None  . Transportation needs - medical: None  . Transportation needs - non-medical: None  Occupational History  . None  Tobacco Use  . Smoking status: Never Smoker  . Smokeless tobacco: Never Used  Substance and  Sexual Activity  . Alcohol use: Yes    Alcohol/week: 1.2 oz    Types: 2 Glasses of wine per week    Comment: wine socially  . Drug use: No  . Sexual activity: Yes    Birth control/protection: Post-menopausal  Other Topics Concern  . None  Social History Narrative  . None    Review of Systems - See HPI.  All other ROS are negative.  BP (!) 140/98   Pulse 62   Temp 98.6 F (37 C) (Oral)   Resp 14   Ht 5\' 4"  (1.626 m)   Wt 195 lb (88.5 kg)   LMP 08/25/2015 (Approximate)   SpO2 98%   BMI 33.47 kg/m   Physical Exam  Constitutional: She is oriented to person, place, and time and well-developed, well-nourished, and in no distress.  HENT:  Head: Normocephalic and atraumatic.  Cardiovascular: Normal rate, regular rhythm, normal heart sounds and intact distal pulses.  Pulmonary/Chest: Effort normal and breath sounds normal. No respiratory distress. She has no wheezes. She has no rales. She exhibits no tenderness.  Neurological: She is alert and oriented to person, place, and time.  Skin: Skin is warm and dry.  Psychiatric: Affect normal.  Vitals reviewed.   Recent Results (from the past 2160 hour(s))  CBC with  Differential/Platelet     Status: None   Collection Time: 08/24/17  9:12 AM  Result Value Ref Range   WBC 4.8 4.0 - 10.5 K/uL   RBC 4.54 3.87 - 5.11 Mil/uL   Hemoglobin 14.2 12.0 - 15.0 g/dL   HCT 42.1 36.0 - 46.0 %   MCV 92.8 78.0 - 100.0 fl   MCHC 33.7 30.0 - 36.0 g/dL   RDW 12.9 11.5 - 15.5 %   Platelets 242.0 150.0 - 400.0 K/uL   Neutrophils Relative % 57.6 43.0 - 77.0 %   Lymphocytes Relative 32.7 12.0 - 46.0 %   Monocytes Relative 8.8 3.0 - 12.0 %   Eosinophils Relative 0.3 0.0 - 5.0 %   Basophils Relative 0.6 0.0 - 3.0 %   Neutro Abs 2.8 1.4 - 7.7 K/uL   Lymphs Abs 1.6 0.7 - 4.0 K/uL   Monocytes Absolute 0.4 0.1 - 1.0 K/uL   Eosinophils Absolute 0.0 0.0 - 0.7 K/uL   Basophils Absolute 0.0 0.0 - 0.1 K/uL  Comprehensive metabolic panel     Status:  Abnormal   Collection Time: 08/24/17  9:12 AM  Result Value Ref Range   Sodium 137 135 - 145 mEq/L   Potassium 4.2 3.5 - 5.1 mEq/L   Chloride 101 96 - 112 mEq/L   CO2 28 19 - 32 mEq/L   Glucose, Bld 123 (H) 70 - 99 mg/dL   BUN 21 6 - 23 mg/dL   Creatinine, Ser 0.86 0.40 - 1.20 mg/dL   Total Bilirubin 0.6 0.2 - 1.2 mg/dL   Alkaline Phosphatase 73 39 - 117 U/L   AST 31 0 - 37 U/L   ALT 63 (H) 0 - 35 U/L   Total Protein 7.1 6.0 - 8.3 g/dL   Albumin 4.6 3.5 - 5.2 g/dL   Calcium 10.2 8.4 - 10.5 mg/dL   GFR 72.63 >60.00 mL/min  Lipid panel     Status: Abnormal   Collection Time: 08/24/17  9:12 AM  Result Value Ref Range   Cholesterol 231 (H) 0 - 200 mg/dL    Comment: ATP III Classification       Desirable:  < 200 mg/dL               Borderline High:  200 - 239 mg/dL          High:  > = 240 mg/dL   Triglycerides 216.0 (H) 0.0 - 149.0 mg/dL    Comment: Normal:  <150 mg/dLBorderline High:  150 - 199 mg/dL   HDL 65.80 >39.00 mg/dL   VLDL 43.2 (H) 0.0 - 40.0 mg/dL   Total CHOL/HDL Ratio 4     Comment:                Men          Women1/2 Average Risk     3.4          3.3Average Risk          5.0          4.42X Average Risk          9.6          7.13X Average Risk          15.0          11.0                       NonHDL 164.96     Comment: NOTE:  Non-HDL goal should  be 30 mg/dL higher than patient's LDL goal (i.e. LDL goal of < 70 mg/dL, would have non-HDL goal of < 100 mg/dL)  Hemoglobin A1c     Status: None   Collection Time: 08/24/17  9:12 AM  Result Value Ref Range   Hgb A1c MFr Bld 6.1 4.6 - 6.5 %    Comment: Glycemic Control Guidelines for People with Diabetes:Non Diabetic:  <6%Goal of Therapy: <7%Additional Action Suggested:  >8%   Hepatitis C Antibody     Status: None   Collection Time: 08/24/17  9:12 AM  Result Value Ref Range   Hepatitis C Ab NON-REACTIVE NON-REACTI   SIGNAL TO CUT-OFF 0.00 <1.00  LDL cholesterol, direct     Status: None   Collection Time: 08/24/17  9:12 AM    Result Value Ref Range   Direct LDL 138.0 mg/dL    Comment: Optimal:  <100 mg/dLNear or Above Optimal:  100-129 mg/dLBorderline High:  130-159 mg/dLHigh:  160-189 mg/dLVery High:  >190 mg/dL  HM MAMMOGRAPHY     Status: None   Collection Time: 09/01/17 12:00 AM  Result Value Ref Range   HM Mammogram 0-4 Bi-Rad 0-4 Bi-Rad, Self Reported Normal   Assessment/Plan: Essential hypertension BP still elevated. Encouraged her to follow dietary and exercise regimen given. Will start low-dose losartan 25 mg daily. Follow-up 2-3 weeks for reassessment and to check BMP.     Leeanne Rio, PA-C

## 2017-10-13 ENCOUNTER — Other Ambulatory Visit: Payer: Self-pay

## 2017-10-13 ENCOUNTER — Ambulatory Visit: Payer: BLUE CROSS/BLUE SHIELD | Admitting: Physician Assistant

## 2017-10-13 ENCOUNTER — Encounter: Payer: Self-pay | Admitting: Physician Assistant

## 2017-10-13 VITALS — BP 110/70 | HR 61 | Temp 97.6°F | Resp 14 | Ht 64.0 in | Wt 197.0 lb

## 2017-10-13 DIAGNOSIS — Z01419 Encounter for gynecological examination (general) (routine) without abnormal findings: Secondary | ICD-10-CM | POA: Diagnosis not present

## 2017-10-13 DIAGNOSIS — I10 Essential (primary) hypertension: Secondary | ICD-10-CM

## 2017-10-13 DIAGNOSIS — Z23 Encounter for immunization: Secondary | ICD-10-CM | POA: Diagnosis not present

## 2017-10-13 DIAGNOSIS — Z87410 Personal history of cervical dysplasia: Secondary | ICD-10-CM | POA: Diagnosis not present

## 2017-10-13 DIAGNOSIS — Z780 Asymptomatic menopausal state: Secondary | ICD-10-CM | POA: Diagnosis not present

## 2017-10-13 DIAGNOSIS — Z6833 Body mass index (BMI) 33.0-33.9, adult: Secondary | ICD-10-CM | POA: Diagnosis not present

## 2017-10-13 LAB — HM PAP SMEAR: HM PAP: ABNORMAL

## 2017-10-13 NOTE — Progress Notes (Signed)
Pre visit review using our clinic review tool, if applicable. No additional management support is needed unless otherwise documented below in the visit note. 

## 2017-10-13 NOTE — Assessment & Plan Note (Signed)
BP much improved. Asymptomatic. Repeat BMP today. If normal will follow-up in 3 months.

## 2017-10-13 NOTE — Addendum Note (Signed)
Addended by: Leonidas Romberg on: 10/13/2017 04:51 PM   Modules accepted: Orders

## 2017-10-13 NOTE — Patient Instructions (Signed)
Please go to the lab today for blood work.  I will call you with your results. We will alter treatment regimen(s) if indicated by your results.   Please continue current medication regimen. Follow-up in 3 months. Return sooner if needed.

## 2017-10-13 NOTE — Progress Notes (Signed)
Patient presents to clinic today for follow-up of hypertension. At last visit, patient was started on a regimen of losartan 25 mg daily. Endorses taking as directed without side effect. Is watching diet. Has not been checking BP at home. Patient denies chest pain, palpitations, lightheadedness, dizziness, vision changes or frequent headaches.  BP Readings from Last 3 Encounters:  10/13/17 110/70  09/22/17 (!) 140/98  08/24/17 (!) 142/90   Past Medical History:  Diagnosis Date  . History of chickenpox   . Hx of adenomatous polyp of colon 11/27/2015  . Plantar fasciitis   . Vaginal tear resulting from childbirth    occ will bleed after large bm with this     Current Outpatient Medications on File Prior to Visit  Medication Sig Dispense Refill  . aspirin EC 81 MG tablet Take 81 mg by mouth daily.    . cholecalciferol (VITAMIN D) 1000 UNITS tablet Take 1,000 Units by mouth daily.    Marland Kitchen losartan (COZAAR) 25 MG tablet Take 1 tablet (25 mg total) daily by mouth. 90 tablet 0  . Multiple Vitamin (MULTIVITAMIN) tablet Take 1 tablet by mouth daily.    . Nutritional Supplements (JUICE PLUS FIBRE PO) Take 1 tablet by mouth daily.    . Omega-3 Fatty Acids (FISH OIL) 1200 MG CAPS Take 1 capsule daily by mouth.     No current facility-administered medications on file prior to visit.     No Known Allergies  Family History  Problem Relation Age of Onset  . Diabetes Maternal Grandmother   . Colon cancer Neg Hx   . Colon polyps Neg Hx   . Esophageal cancer Neg Hx   . Rectal cancer Neg Hx   . Stomach cancer Neg Hx     Social History   Socioeconomic History  . Marital status: Married    Spouse name: None  . Number of children: None  . Years of education: None  . Highest education level: None  Social Needs  . Financial resource strain: None  . Food insecurity - worry: None  . Food insecurity - inability: None  . Transportation needs - medical: None  . Transportation needs -  non-medical: None  Occupational History  . None  Tobacco Use  . Smoking status: Never Smoker  . Smokeless tobacco: Never Used  Substance and Sexual Activity  . Alcohol use: Yes    Alcohol/week: 1.2 oz    Types: 2 Glasses of wine per week    Comment: wine socially  . Drug use: No  . Sexual activity: Yes    Birth control/protection: Post-menopausal  Other Topics Concern  . None  Social History Narrative  . None   Review of Systems - See HPI.  All other ROS are negative.  BP 110/70   Pulse 61   Temp 97.6 F (36.4 C) (Oral)   Resp 14   Ht 5\' 4"  (1.626 m)   Wt 197 lb (89.4 kg)   LMP 08/25/2015 (Approximate)   SpO2 98%   BMI 33.81 kg/m   Physical Exam  Constitutional: She is oriented to person, place, and time and well-developed, well-nourished, and in no distress.  HENT:  Head: Normocephalic and atraumatic.  Eyes: Conjunctivae are normal.  Cardiovascular: Normal rate, regular rhythm, normal heart sounds and intact distal pulses.  Pulmonary/Chest: Effort normal and breath sounds normal. No respiratory distress. She has no wheezes. She has no rales. She exhibits no tenderness.  Neurological: She is alert and oriented to person, place, and  time.  Skin: Skin is warm and dry. No rash noted.  Psychiatric: Affect normal.  Vitals reviewed.   Recent Results (from the past 2160 hour(s))  CBC with Differential/Platelet     Status: None   Collection Time: 08/24/17  9:12 AM  Result Value Ref Range   WBC 4.8 4.0 - 10.5 K/uL   RBC 4.54 3.87 - 5.11 Mil/uL   Hemoglobin 14.2 12.0 - 15.0 g/dL   HCT 42.1 36.0 - 46.0 %   MCV 92.8 78.0 - 100.0 fl   MCHC 33.7 30.0 - 36.0 g/dL   RDW 12.9 11.5 - 15.5 %   Platelets 242.0 150.0 - 400.0 K/uL   Neutrophils Relative % 57.6 43.0 - 77.0 %   Lymphocytes Relative 32.7 12.0 - 46.0 %   Monocytes Relative 8.8 3.0 - 12.0 %   Eosinophils Relative 0.3 0.0 - 5.0 %   Basophils Relative 0.6 0.0 - 3.0 %   Neutro Abs 2.8 1.4 - 7.7 K/uL   Lymphs Abs 1.6  0.7 - 4.0 K/uL   Monocytes Absolute 0.4 0.1 - 1.0 K/uL   Eosinophils Absolute 0.0 0.0 - 0.7 K/uL   Basophils Absolute 0.0 0.0 - 0.1 K/uL  Comprehensive metabolic panel     Status: Abnormal   Collection Time: 08/24/17  9:12 AM  Result Value Ref Range   Sodium 137 135 - 145 mEq/L   Potassium 4.2 3.5 - 5.1 mEq/L   Chloride 101 96 - 112 mEq/L   CO2 28 19 - 32 mEq/L   Glucose, Bld 123 (H) 70 - 99 mg/dL   BUN 21 6 - 23 mg/dL   Creatinine, Ser 0.86 0.40 - 1.20 mg/dL   Total Bilirubin 0.6 0.2 - 1.2 mg/dL   Alkaline Phosphatase 73 39 - 117 U/L   AST 31 0 - 37 U/L   ALT 63 (H) 0 - 35 U/L   Total Protein 7.1 6.0 - 8.3 g/dL   Albumin 4.6 3.5 - 5.2 g/dL   Calcium 10.2 8.4 - 10.5 mg/dL   GFR 72.63 >60.00 mL/min  Lipid panel     Status: Abnormal   Collection Time: 08/24/17  9:12 AM  Result Value Ref Range   Cholesterol 231 (H) 0 - 200 mg/dL    Comment: ATP III Classification       Desirable:  < 200 mg/dL               Borderline High:  200 - 239 mg/dL          High:  > = 240 mg/dL   Triglycerides 216.0 (H) 0.0 - 149.0 mg/dL    Comment: Normal:  <150 mg/dLBorderline High:  150 - 199 mg/dL   HDL 65.80 >39.00 mg/dL   VLDL 43.2 (H) 0.0 - 40.0 mg/dL   Total CHOL/HDL Ratio 4     Comment:                Men          Women1/2 Average Risk     3.4          3.3Average Risk          5.0          4.42X Average Risk          9.6          7.13X Average Risk          15.0          11.0  NonHDL 164.96     Comment: NOTE:  Non-HDL goal should be 30 mg/dL higher than patient's LDL goal (i.e. LDL goal of < 70 mg/dL, would have non-HDL goal of < 100 mg/dL)  Hemoglobin A1c     Status: None   Collection Time: 08/24/17  9:12 AM  Result Value Ref Range   Hgb A1c MFr Bld 6.1 4.6 - 6.5 %    Comment: Glycemic Control Guidelines for People with Diabetes:Non Diabetic:  <6%Goal of Therapy: <7%Additional Action Suggested:  >8%   Hepatitis C Antibody     Status: None   Collection Time: 08/24/17  9:12  AM  Result Value Ref Range   Hepatitis C Ab NON-REACTIVE NON-REACTI   SIGNAL TO CUT-OFF 0.00 <1.00  LDL cholesterol, direct     Status: None   Collection Time: 08/24/17  9:12 AM  Result Value Ref Range   Direct LDL 138.0 mg/dL    Comment: Optimal:  <100 mg/dLNear or Above Optimal:  100-129 mg/dLBorderline High:  130-159 mg/dLHigh:  160-189 mg/dLVery High:  >190 mg/dL  HM MAMMOGRAPHY     Status: None   Collection Time: 09/01/17 12:00 AM  Result Value Ref Range   HM Mammogram 0-4 Bi-Rad 0-4 Bi-Rad, Self Reported Normal   Assessment/Plan: Essential hypertension BP much improved. Asymptomatic. Repeat BMP today. If normal will follow-up in 3 months.    Leeanne Rio, PA-C

## 2017-10-14 LAB — BASIC METABOLIC PANEL
BUN/Creatinine Ratio: 19 (calc) (ref 6–22)
BUN: 20 mg/dL (ref 7–25)
CHLORIDE: 104 mmol/L (ref 98–110)
CO2: 29 mmol/L (ref 20–32)
CREATININE: 1.07 mg/dL — AB (ref 0.50–1.05)
Calcium: 9.8 mg/dL (ref 8.6–10.4)
Glucose, Bld: 101 mg/dL — ABNORMAL HIGH (ref 65–99)
POTASSIUM: 3.9 mmol/L (ref 3.5–5.3)
SODIUM: 140 mmol/L (ref 135–146)

## 2017-12-15 ENCOUNTER — Telehealth: Payer: Self-pay | Admitting: Physician Assistant

## 2017-12-15 DIAGNOSIS — I10 Essential (primary) hypertension: Secondary | ICD-10-CM

## 2017-12-15 MED ORDER — LOSARTAN POTASSIUM 25 MG PO TABS: 25.0000 mg | ORAL_TABLET | Freq: Every day | ORAL | 3 refills | Status: DC

## 2017-12-15 NOTE — Telephone Encounter (Signed)
Losartan refill Last OV: 09/22/17 Last Refill:09/22/17 Pharmacy:[facility]   Refill x 1 year

## 2017-12-15 NOTE — Telephone Encounter (Signed)
Copied from [place] [id number]. Topic: Quick Communication - See Telephone Encounter >> Dec 15, 2017  3:51 PM Hewitt Shorts wrote: CRM for notification. See Telephone encounter for: pt is needing a refill on her losartan [facility] [address]   Best number  [phone] 12/15/17.

## 2018-01-12 ENCOUNTER — Ambulatory Visit: Payer: BLUE CROSS/BLUE SHIELD | Admitting: Physician Assistant

## 2018-01-19 ENCOUNTER — Encounter: Payer: Self-pay | Admitting: Physician Assistant

## 2018-01-19 ENCOUNTER — Ambulatory Visit: Payer: BLUE CROSS/BLUE SHIELD | Admitting: Physician Assistant

## 2018-01-19 ENCOUNTER — Other Ambulatory Visit: Payer: Self-pay

## 2018-01-19 VITALS — BP 128/80 | HR 61 | Temp 97.7°F | Resp 16 | Ht 64.0 in | Wt 198.6 lb

## 2018-01-19 DIAGNOSIS — I10 Essential (primary) hypertension: Secondary | ICD-10-CM | POA: Diagnosis not present

## 2018-01-19 LAB — BASIC METABOLIC PANEL
BUN: 18 mg/dL (ref 6–23)
CO2: 28 meq/L (ref 19–32)
CREATININE: 0.83 mg/dL (ref 0.40–1.20)
Calcium: 10.1 mg/dL (ref 8.4–10.5)
Chloride: 102 mEq/L (ref 96–112)
GFR: 75.56 mL/min (ref >60.00)
Glucose, Bld: 123 mg/dL — ABNORMAL HIGH (ref 70–99)
Potassium: 4.2 mEq/L (ref 3.5–5.1)
Sodium: 138 mEq/L (ref 135–145)

## 2018-01-19 NOTE — Patient Instructions (Signed)
Please go to the lab today for blood work.  I will call you with your results. We will alter treatment regimen(s) if indicated by your results.   Things looks great today. Keep up with current regimen, diet and exercise.  I will see you in 6 months for a complete physical. Return sooner if needed!   Hypertension Hypertension, commonly called high blood pressure, is when the force of blood pumping through the arteries is too strong. The arteries are the blood vessels that carry blood from the heart throughout the body. Hypertension forces the heart to work harder to pump blood and may cause arteries to become narrow or stiff. Having untreated or uncontrolled hypertension can cause heart attacks, strokes, kidney disease, and other problems. A blood pressure reading consists of a higher number over a lower number. Ideally, your blood pressure should be below 120/80. The first ("top") number is called the systolic pressure. It is a measure of the pressure in your arteries as your heart beats. The second ("bottom") number is called the diastolic pressure. It is a measure of the pressure in your arteries as the heart relaxes. What are the causes? The cause of this condition is not known. What increases the risk? Some risk factors for high blood pressure are under your control. Others are not. Factors you can change  Smoking.  Having type 2 diabetes mellitus, high cholesterol, or both.  Not getting enough exercise or physical activity.  Being overweight.  Having too much fat, sugar, calories, or salt (sodium) in your diet.  Drinking too much alcohol. Factors that are difficult or impossible to change  Having chronic kidney disease.  Having a family history of high blood pressure.  Age. Risk increases with age.  Race. You may be at higher risk if you are African-American.  Gender. Men are at higher risk than women before age 65. After age 57, women are at higher risk than  men.  Having obstructive sleep apnea.  Stress. What are the signs or symptoms? Extremely high blood pressure (hypertensive crisis) may cause:  Headache.  Anxiety.  Shortness of breath.  Nosebleed.  Nausea and vomiting.  Severe chest pain.  Jerky movements you cannot control (seizures).  How is this diagnosed? This condition is diagnosed by measuring your blood pressure while you are seated, with your arm resting on a surface. The cuff of the blood pressure monitor will be placed directly against the skin of your upper arm at the level of your heart. It should be measured at least twice using the same arm. Certain conditions can cause a difference in blood pressure between your right and left arms. Certain factors can cause blood pressure readings to be lower or higher than normal (elevated) for a short period of time:  When your blood pressure is higher when you are in a health care provider's office than when you are at home, this is called white coat hypertension. Most people with this condition do not need medicines.  When your blood pressure is higher at home than when you are in a health care provider's office, this is called masked hypertension. Most people with this condition may need medicines to control blood pressure.  If you have a high blood pressure reading during one visit or you have normal blood pressure with other risk factors:  You may be asked to return on a different day to have your blood pressure checked again.  You may be asked to monitor your blood pressure at home  for 1 week or longer.  If you are diagnosed with hypertension, you may have other blood or imaging tests to help your health care provider understand your overall risk for other conditions. How is this treated? This condition is treated by making healthy lifestyle changes, such as eating healthy foods, exercising more, and reducing your alcohol intake. Your health care provider may prescribe  medicine if lifestyle changes are not enough to get your blood pressure under control, and if:  Your systolic blood pressure is above 130.  Your diastolic blood pressure is above 80.  Your personal target blood pressure may vary depending on your medical conditions, your age, and other factors. Follow these instructions at home: Eating and drinking  Eat a diet that is high in fiber and potassium, and low in sodium, added sugar, and fat. An example eating plan is called the DASH (Dietary Approaches to Stop Hypertension) diet. To eat this way: ? Eat plenty of fresh fruits and vegetables. Try to fill half of your plate at each meal with fruits and vegetables. ? Eat whole grains, such as whole wheat pasta, brown rice, or whole grain bread. Fill about one quarter of your plate with whole grains. ? Eat or drink low-fat dairy products, such as skim milk or low-fat yogurt. ? Avoid fatty cuts of meat, processed or cured meats, and poultry with skin. Fill about one quarter of your plate with lean proteins, such as fish, chicken without skin, beans, eggs, and tofu. ? Avoid premade and processed foods. These tend to be higher in sodium, added sugar, and fat.  Reduce your daily sodium intake. Most people with hypertension should eat less than 1,500 mg of sodium a day.  Limit alcohol intake to no more than 1 drink a day for nonpregnant women and 2 drinks a day for men. One drink equals 12 oz of beer, 5 oz of wine, or 1 oz of hard liquor. Lifestyle  Work with your health care provider to maintain a healthy body weight or to lose weight. Ask what an ideal weight is for you.  Get at least 30 minutes of exercise that causes your heart to beat faster (aerobic exercise) most days of the week. Activities may include walking, swimming, or biking.  Include exercise to strengthen your muscles (resistance exercise), such as pilates or lifting weights, as part of your weekly exercise routine. Try to do these types  of exercises for 30 minutes at least 3 days a week.  Do not use any products that contain nicotine or tobacco, such as cigarettes and e-cigarettes. If you need help quitting, ask your health care provider.  Monitor your blood pressure at home as told by your health care provider.  Keep all follow-up visits as told by your health care provider. This is important. Medicines  Take over-the-counter and prescription medicines only as told by your health care provider. Follow directions carefully. Blood pressure medicines must be taken as prescribed.  Do not skip doses of blood pressure medicine. Doing this puts you at risk for problems and can make the medicine less effective.  Ask your health care provider about side effects or reactions to medicines that you should watch for. Contact a health care provider if:  You think you are having a reaction to a medicine you are taking.  You have headaches that keep coming back (recurring).  You feel dizzy.  You have swelling in your ankles.  You have trouble with your vision. Get help right away if:  You develop a severe headache or confusion.  You have unusual weakness or numbness.  You feel faint.  You have severe pain in your chest or abdomen.  You vomit repeatedly.  You have trouble breathing. Summary  Hypertension is when the force of blood pumping through your arteries is too strong. If this condition is not controlled, it may put you at risk for serious complications.  Your personal target blood pressure may vary depending on your medical conditions, your age, and other factors. For most people, a normal blood pressure is less than 120/80.  Hypertension is treated with lifestyle changes, medicines, or a combination of both. Lifestyle changes include weight loss, eating a healthy, low-sodium diet, exercising more, and limiting alcohol. This information is not intended to replace advice given to you by your health care provider.  Make sure you discuss any questions you have with your health care provider. Document Released: 11/02/2005 Document Revised: 09/30/2016 Document Reviewed: 09/30/2016 Elsevier Interactive Patient Education  [employer].

## 2018-01-19 NOTE — Assessment & Plan Note (Signed)
Blood pressure normotensive.  Asymptomatic.  Doing well on current regimen.  Continue the same.  Will check BMP today.  Follow-up in 6 months for complete physical examination.  Return to clinic sooner if needed.

## 2018-01-19 NOTE — Progress Notes (Signed)
Patient presents to clinic today for 66-month follow-up of hypertension.  Patient is currently on a regimen of losartan 25 mg daily.  Endorses taking medications as directed.  Denies side effects.  He is keeping a well-balanced diet and trying to stay active.  Is currently going to the [facility] 2 times a week, where she engages in cardio and resistance training.  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 Patient denies chest pain, palpitations, lightheadedness, dizziness, vision changes or frequent headaches.  BP Readings from Last 3 Encounters:  01/19/18 128/80  10/13/17 110/70  09/22/17 (!) 140/98   Past Medical History:  Diagnosis Date  . History of chickenpox   . Hx of adenomatous polyp of colon 11/27/2015  . Plantar fasciitis   . Vaginal tear resulting from childbirth    occ will bleed after large bm with this     Current Outpatient Medications on File Prior to Visit  Medication Sig Dispense Refill  . aspirin EC 81 MG tablet Take 81 mg by mouth daily.    . cholecalciferol (VITAMIN D) 1000 UNITS tablet Take 1,000 Units by mouth daily.    Marland Kitchen losartan (COZAAR) 25 MG tablet Take 1 tablet (25 mg total) by mouth daily. 90 tablet 3  . Multiple Vitamin (MULTIVITAMIN) tablet Take 1 tablet by mouth daily.    . Nutritional Supplements (JUICE PLUS FIBRE PO) Take 1 tablet by mouth daily.    . Omega-3 Fatty Acids (FISH OIL) 1200 MG CAPS Take 1 capsule daily by mouth.     No current facility-administered medications on file prior to visit.     No Known Allergies  Family History  Problem Relation Age of Onset  . Diabetes Maternal Grandmother   . Colon cancer Neg Hx   . Colon polyps Neg Hx   . Esophageal cancer Neg Hx   . Rectal cancer Neg Hx   . Stomach cancer Neg Hx     Social History   Socioeconomic History  . Marital status: Married    Spouse name: None  . Number of children: None  . Years of education: None  .  Highest education level: None  Social Needs  . Financial resource strain: None  . Food insecurity - worry: None  . Food insecurity - inability: None  . Transportation needs - medical: None  . Transportation needs - non-medical: None  Occupational History  . None  Tobacco Use  . Smoking status: Never Smoker  . Smokeless tobacco: Never Used  Substance and Sexual Activity  . Alcohol use: Yes    Alcohol/week: 1.2 oz    Types: 2 Glasses of wine per week    Comment: wine socially  . Drug use: No  . Sexual activity: Yes    Birth control/protection: Post-menopausal  Other Topics Concern  . None  Social History Narrative  . None   Review of Systems - See HPI.  All other ROS are negative.  BP 128/80   Pulse 61   Temp 97.7 F (36.5 C) (Oral)   Resp 16   Ht 5\' 4"  (1.626 m)   Wt 198 lb 9.6 oz (90.1 kg)   LMP 08/25/2015 (Approximate)   SpO2 97%   BMI 34.09 kg/m   Physical Exam  Constitutional: She is oriented to person, place, and time and well-developed, well-nourished, and in no distress.  HENT:  Head: Normocephalic and atraumatic.  Eyes: Conjunctivae are normal.  Neck:  Neck supple.  Cardiovascular: Normal rate, regular rhythm, normal heart sounds and intact distal pulses.  Pulmonary/Chest: Effort normal and breath sounds normal. No respiratory distress. She has no wheezes. She has no rales. She exhibits no tenderness.  Neurological: She is alert and oriented to person, place, and time.  Skin: Skin is warm and dry. No rash noted.  Psychiatric: Affect normal.  Vitals reviewed.  Assessment/Plan: Essential hypertension Blood pressure normotensive.  Asymptomatic.  Doing well on current regimen.  Continue the same.  Will check BMP today.  Follow-up in 6 months for complete physical examination.  Return to clinic sooner if needed.    Leeanne Rio, PA-C

## 2018-01-20 ENCOUNTER — Encounter: Payer: Self-pay | Admitting: Emergency Medicine

## 2018-01-21 DIAGNOSIS — N841 Polyp of cervix uteri: Secondary | ICD-10-CM | POA: Diagnosis not present

## 2018-01-21 DIAGNOSIS — N870 Mild cervical dysplasia: Secondary | ICD-10-CM | POA: Diagnosis not present

## 2018-01-21 DIAGNOSIS — Z3202 Encounter for pregnancy test, result negative: Secondary | ICD-10-CM | POA: Diagnosis not present

## 2018-01-21 DIAGNOSIS — N879 Dysplasia of cervix uteri, unspecified: Secondary | ICD-10-CM | POA: Diagnosis not present

## 2018-02-24 DIAGNOSIS — N870 Mild cervical dysplasia: Secondary | ICD-10-CM | POA: Diagnosis not present

## 2018-08-31 ENCOUNTER — Encounter: Payer: BLUE CROSS/BLUE SHIELD | Admitting: Physician Assistant

## 2018-09-05 DIAGNOSIS — S52514A Nondisplaced fracture of right radial styloid process, initial encounter for closed fracture: Secondary | ICD-10-CM | POA: Diagnosis not present

## 2018-09-07 ENCOUNTER — Encounter: Payer: Self-pay | Admitting: Gynecology

## 2018-09-07 DIAGNOSIS — Z1231 Encounter for screening mammogram for malignant neoplasm of breast: Secondary | ICD-10-CM | POA: Diagnosis not present

## 2018-09-07 LAB — HM MAMMOGRAPHY

## 2018-09-14 ENCOUNTER — Other Ambulatory Visit: Payer: Self-pay

## 2018-09-14 ENCOUNTER — Ambulatory Visit (INDEPENDENT_AMBULATORY_CARE_PROVIDER_SITE_OTHER): Payer: BLUE CROSS/BLUE SHIELD | Admitting: Physician Assistant

## 2018-09-14 ENCOUNTER — Encounter: Payer: Self-pay | Admitting: Physician Assistant

## 2018-09-14 VITALS — BP 110/82 | HR 67 | Temp 97.9°F | Resp 14 | Ht 64.0 in | Wt 198.0 lb

## 2018-09-14 DIAGNOSIS — Z0000 Encounter for general adult medical examination without abnormal findings: Secondary | ICD-10-CM | POA: Diagnosis not present

## 2018-09-14 DIAGNOSIS — Z7689 Persons encountering health services in other specified circumstances: Secondary | ICD-10-CM

## 2018-09-14 DIAGNOSIS — I10 Essential (primary) hypertension: Secondary | ICD-10-CM | POA: Diagnosis not present

## 2018-09-14 LAB — CBC WITH DIFFERENTIAL/PLATELET
Basophils Absolute: 0 10*3/uL (ref 0.0–0.1)
Basophils Relative: 0.7 % (ref 0.0–3.0)
EOS PCT: 0.9 % (ref 0.0–5.0)
Eosinophils Absolute: 0.1 10*3/uL (ref 0.0–0.7)
HCT: 40.5 % (ref 36.0–46.0)
HEMOGLOBIN: 14 g/dL (ref 12.0–15.0)
LYMPHS PCT: 36.5 % (ref 12.0–46.0)
Lymphs Abs: 2 10*3/uL (ref 0.7–4.0)
MCHC: 34.7 g/dL (ref 30.0–36.0)
MCV: 90.5 fl (ref 78.0–100.0)
MONOS PCT: 8.4 % (ref 3.0–12.0)
Monocytes Absolute: 0.5 10*3/uL (ref 0.1–1.0)
Neutro Abs: 3 10*3/uL (ref 1.4–7.7)
Neutrophils Relative %: 53.5 % (ref 43.0–77.0)
Platelets: 248 10*3/uL (ref 150.0–400.0)
RBC: 4.48 Mil/uL (ref 3.87–5.11)
RDW: 12.4 % (ref 11.5–15.5)
WBC: 5.5 10*3/uL (ref 4.0–10.5)

## 2018-09-14 LAB — COMPREHENSIVE METABOLIC PANEL
ALBUMIN: 4.6 g/dL (ref 3.5–5.2)
ALT: 56 U/L — ABNORMAL HIGH (ref 0–35)
AST: 35 U/L (ref 0–37)
Alkaline Phosphatase: 62 U/L (ref 39–117)
BUN: 18 mg/dL (ref 6–23)
CO2: 27 mEq/L (ref 19–32)
Calcium: 9.7 mg/dL (ref 8.4–10.5)
Chloride: 103 mEq/L (ref 96–112)
Creatinine, Ser: 0.89 mg/dL (ref 0.40–1.20)
GFR: 69.55 mL/min (ref >60.00)
Glucose, Bld: 126 mg/dL — ABNORMAL HIGH (ref 70–99)
POTASSIUM: 4.2 meq/L (ref 3.5–5.1)
SODIUM: 139 meq/L (ref 135–145)
Total Bilirubin: 0.5 mg/dL (ref 0.2–1.2)
Total Protein: 7.2 g/dL (ref 6.0–8.3)

## 2018-09-14 LAB — LIPID PANEL
Cholesterol: 206 mg/dL — ABNORMAL HIGH (ref 0–200)
HDL: 65.8 mg/dL (ref >39.00)
LDL Cholesterol: 105 mg/dL — ABNORMAL HIGH (ref 0–99)
NonHDL: 139.96
Total CHOL/HDL Ratio: 3
Triglycerides: 174 mg/dL — ABNORMAL HIGH (ref 0.0–149.0)
VLDL: 34.8 mg/dL (ref 0.0–40.0)

## 2018-09-14 LAB — HEMOGLOBIN A1C: HEMOGLOBIN A1C: 6.2 % (ref 4.6–6.5)

## 2018-09-14 NOTE — Patient Instructions (Signed)
Please go to the lab for blood work.   Our office will call you with your results unless you have chosen to receive results via MyChart.  If your blood work is normal we will follow-up each year for physicals and as scheduled for chronic medical problems.  If anything is abnormal we will treat accordingly and get you in for a follow-up.  You will be contacted for assessment by Dermatology.   Preventive Care 40-64 Years, Female Preventive care refers to lifestyle choices and visits with your health care provider that can promote health and wellness. What does preventive care include?  A yearly physical exam. This is also called an annual well check.  Dental exams once or twice a year.  Routine eye exams. Ask your health care provider how often you should have your eyes checked.  Personal lifestyle choices, including: ? Daily care of your teeth and gums. ? Regular physical activity. ? Eating a healthy diet. ? Avoiding tobacco and drug use. ? Limiting alcohol use. ? Practicing safe sex. ? Taking low-dose aspirin daily starting at age 85. ? Taking vitamin and mineral supplements as recommended by your health care provider. What happens during an annual well check? The services and screenings done by your health care provider during your annual well check will depend on your age, overall health, lifestyle risk factors, and family history of disease. Counseling Your health care provider may ask you questions about your:  Alcohol use.  Tobacco use.  Drug use.  Emotional well-being.  Home and relationship well-being.  Sexual activity.  Eating habits.  Work and work Statistician.  Method of birth control.  Menstrual cycle.  Pregnancy history.  Screening You may have the following tests or measurements:  Height, weight, and BMI.  Blood pressure.  Lipid and cholesterol levels. These may be checked every 5 years, or more frequently if you are over 47 years  old.  Skin check.  Lung cancer screening. You may have this screening every year starting at age 75 if you have a 30-pack-year history of smoking and currently smoke or have quit within the past 15 years.  Fecal occult blood test (FOBT) of the stool. You may have this test every year starting at age 84.  Flexible sigmoidoscopy or colonoscopy. You may have a sigmoidoscopy every 5 years or a colonoscopy every 10 years starting at age 90.  Hepatitis C blood test.  Hepatitis B blood test.  Sexually transmitted disease (STD) testing.  Diabetes screening. This is done by checking your blood sugar (glucose) after you have not eaten for a while (fasting). You may have this done every 1-3 years.  Mammogram. This may be done every 1-2 years. Talk to your health care provider about when you should start having regular mammograms. This may depend on whether you have a family history of breast cancer.  BRCA-related cancer screening. This may be done if you have a family history of breast, ovarian, tubal, or peritoneal cancers.  Pelvic exam and Pap test. This may be done every 3 years starting at age 73. Starting at age 10, this may be done every 5 years if you have a Pap test in combination with an HPV test.  Bone density scan. This is done to screen for osteoporosis. You may have this scan if you are at high risk for osteoporosis.  Discuss your test results, treatment options, and if necessary, the need for more tests with your health care provider. Vaccines Your health care provider may  recommend certain vaccines, such as:  Influenza vaccine. This is recommended every year.  Tetanus, diphtheria, and acellular pertussis (Tdap, Td) vaccine. You may need a Td booster every 10 years.  Varicella vaccine. You may need this if you have not been vaccinated.  Zoster vaccine. You may need this after age 76.  Measles, mumps, and rubella (MMR) vaccine. You may need at least one dose of MMR if you were  born in 1957 or later. You may also need a second dose.  Pneumococcal 13-valent conjugate (PCV13) vaccine. You may need this if you have certain conditions and were not previously vaccinated.  Pneumococcal polysaccharide (PPSV23) vaccine. You may need one or two doses if you smoke cigarettes or if you have certain conditions.  Meningococcal vaccine. You may need this if you have certain conditions.  Hepatitis A vaccine. You may need this if you have certain conditions or if you travel or work in places where you may be exposed to hepatitis A.  Hepatitis B vaccine. You may need this if you have certain conditions or if you travel or work in places where you may be exposed to hepatitis B.  Haemophilus influenzae type b (Hib) vaccine. You may need this if you have certain conditions.  Talk to your health care provider about which screenings and vaccines you need and how often you need them. This information is not intended to replace advice given to you by your health care provider. Make sure you discuss any questions you have with your health care provider. Document Released: 11/29/2015 Document Revised: 07/22/2016 Document Reviewed: 09/03/2015 Elsevier Interactive Patient Education  [employer].

## 2018-09-14 NOTE — Progress Notes (Signed)
Patient presents to clinic today for annual exam.  Patient is fasting for labs.  Diet -- Is watching portion sizes. Exercise -- plays tennis often  Acute Concerns: Requesting referral to dermatology for a comprehensive skin exam. No current concerns.   Chronic Issues: Hypertension -- Is taking her losartan 25 mg QD. Notes tolerating well overall. Patient denies chest pain, palpitations, lightheadedness, dizziness, vision changes or frequent headaches.  BP Readings from Last 3 Encounters:  09/14/18 110/82  01/19/18 128/80  10/13/17 110/70   Health Maintenance: Immunizations -- up-to-date. Colonoscopy -- up-to-date Mammogram -- Scheduled with [facility] -- Scheduled in January with GYN   Past Medical History:  Diagnosis Date  . History of chickenpox   . Hx of adenomatous polyp of colon 11/27/2015  . Plantar fasciitis   . Vaginal tear resulting from childbirth    occ will bleed after large bm with this     Past Surgical History:  Procedure Laterality Date  . CESAREAN SECTION  1997  . VAGINAL DELIVERY     x2  . WISDOM TOOTH EXTRACTION      Current Outpatient Medications on File Prior to Visit  Medication Sig Dispense Refill  . aspirin EC 81 MG tablet Take 81 mg by mouth daily.    . cholecalciferol (VITAMIN D) 1000 UNITS tablet Take 1,000 Units by mouth daily.    Marland Kitchen losartan (COZAAR) 25 MG tablet Take 1 tablet (25 mg total) by mouth daily. 90 tablet 3  . Multiple Vitamin (MULTIVITAMIN) tablet Take 1 tablet by mouth daily.    . Nutritional Supplements (JUICE PLUS FIBRE PO) Take 1 tablet by mouth daily.    . Omega-3 Fatty Acids (FISH OIL) 1200 MG CAPS Take 1 capsule daily by mouth.     No current facility-administered medications on file prior to visit.     No Known Allergies  Family History  Problem Relation Age of Onset  . Diabetes Maternal Grandmother   . Colon cancer Neg Hx   . Colon polyps Neg Hx   . Esophageal cancer Neg Hx   . Rectal cancer Neg Hx   .  Stomach cancer Neg Hx     Social History   Socioeconomic History  . Marital status: Married    Spouse name: Not on file  . Number of children: Not on file  . Years of education: Not on file  . Highest education level: Not on file  Occupational History  . Not on file  Social Needs  . Financial resource strain: Not on file  . Food insecurity:    Worry: Not on file    Inability: Not on file  . Transportation needs:    Medical: Not on file    Non-medical: Not on file  Tobacco Use  . Smoking status: Never Smoker  . Smokeless tobacco: Never Used  Substance and Sexual Activity  . Alcohol use: Yes    Alcohol/week: 2.0 standard drinks    Types: 2 Glasses of wine per week    Comment: wine socially  . Drug use: No  . Sexual activity: Yes    Birth control/protection: Post-menopausal  Lifestyle  . Physical activity:    Days per week: Not on file    Minutes per session: Not on file  . Stress: Not on file  Relationships  . Social connections:    Talks on phone: Not on file    Gets together: Not on file    Attends religious service: Not on file  Active member of club or organization: Not on file    Attends meetings of clubs or organizations: Not on file    Relationship status: Not on file  . Intimate partner violence:    Fear of current or ex partner: Not on file    Emotionally abused: Not on file    Physically abused: Not on file    Forced sexual activity: Not on file  Other Topics Concern  . Not on file  Social History Narrative  . Not on file   Review of Systems  Constitutional: Negative for fever and weight loss.  HENT: Negative for ear discharge, ear pain, hearing loss and tinnitus.   Eyes: Negative for blurred vision, double vision, photophobia and pain.  Respiratory: Negative for cough and shortness of breath.   Cardiovascular: Negative for chest pain and palpitations.  Gastrointestinal: Negative for abdominal pain, blood in stool, constipation, diarrhea,  heartburn, melena, nausea and vomiting.  Genitourinary: Negative for dysuria, flank pain, frequency, hematuria and urgency.  Musculoskeletal: Negative for falls.  Neurological: Negative for dizziness, loss of consciousness and headaches.  Endo/Heme/Allergies: Negative for environmental allergies.  Psychiatric/Behavioral: Negative for depression, hallucinations, substance abuse and suicidal ideas. The patient is not nervous/anxious and does not have insomnia.     BP 110/82   Pulse 67   Temp 97.9 F (36.6 C) (Oral)   Resp 14   Ht 5\' 4"  (1.626 m)   Wt 198 lb (89.8 kg)   LMP 08/25/2015 (Approximate)   SpO2 98%   BMI 33.99 kg/m   Physical Exam  Constitutional: She is oriented to person, place, and time.  HENT:  Head: Normocephalic and atraumatic.  Right Ear: Tympanic membrane, external ear and ear canal normal.  Left Ear: Tympanic membrane, external ear and ear canal normal.  Nose: Nose normal. No mucosal edema.  Mouth/Throat: Uvula is midline, oropharynx is clear and moist and mucous membranes are normal. No oropharyngeal exudate or posterior oropharyngeal erythema.  Eyes: Pupils are equal, round, and reactive to light. Conjunctivae are normal.  Neck: Neck supple. No thyromegaly present.  Cardiovascular: Normal rate, regular rhythm, normal heart sounds and intact distal pulses.  Pulmonary/Chest: Effort normal and breath sounds normal. No respiratory distress. She has no wheezes. She has no rales.  Abdominal: Soft. Bowel sounds are normal. She exhibits no distension and no mass. There is no tenderness. There is no rebound and no guarding.  Lymphadenopathy:    She has no cervical adenopathy.  Neurological: She is alert and oriented to person, place, and time. No cranial nerve deficit.  Skin: Skin is warm and dry. No rash noted.  Vitals reviewed.  Assessment/Plan: Visit for preventive health examination Depression screen negative. Health Maintenance reviewed. Preventive schedule  discussed and handout given in AVS. Will obtain fasting labs today. Referral to Dermatology placed for comprehensive skin exam per patient preference.     Leeanne Rio, PA-C

## 2018-09-14 NOTE — Assessment & Plan Note (Signed)
Depression screen negative. Health Maintenance reviewed. Preventive schedule discussed and handout given in AVS. Will obtain fasting labs today. Referral to Dermatology placed for comprehensive skin exam per patient preference.

## 2018-09-16 ENCOUNTER — Other Ambulatory Visit: Payer: Self-pay | Admitting: Emergency Medicine

## 2018-09-16 DIAGNOSIS — E782 Mixed hyperlipidemia: Secondary | ICD-10-CM

## 2018-09-16 DIAGNOSIS — R7303 Prediabetes: Secondary | ICD-10-CM

## 2018-09-16 MED ORDER — CINNAMON 500 MG PO TABS: 1.0000 | ORAL_TABLET | Freq: Every day | ORAL | 0 refills | Status: DC

## 2018-09-16 MED ORDER — RED YEAST RICE 600 MG PO TABS: 1.0000 | ORAL_TABLET | Freq: Every day | ORAL | 0 refills | Status: DC

## 2018-09-21 ENCOUNTER — Encounter: Payer: Self-pay | Admitting: Emergency Medicine

## 2018-09-26 DIAGNOSIS — S52501A Unspecified fracture of the lower end of right radius, initial encounter for closed fracture: Secondary | ICD-10-CM | POA: Diagnosis not present

## 2018-10-21 DIAGNOSIS — L570 Actinic keratosis: Secondary | ICD-10-CM | POA: Diagnosis not present

## 2018-10-21 DIAGNOSIS — D1801 Hemangioma of skin and subcutaneous tissue: Secondary | ICD-10-CM | POA: Diagnosis not present

## 2018-10-21 DIAGNOSIS — L821 Other seborrheic keratosis: Secondary | ICD-10-CM | POA: Diagnosis not present

## 2018-10-21 DIAGNOSIS — D2339 Other benign neoplasm of skin of other parts of face: Secondary | ICD-10-CM | POA: Diagnosis not present

## 2018-10-24 DIAGNOSIS — S52501D Unspecified fracture of the lower end of right radius, subsequent encounter for closed fracture with routine healing: Secondary | ICD-10-CM | POA: Diagnosis not present

## 2018-11-23 ENCOUNTER — Other Ambulatory Visit (INDEPENDENT_AMBULATORY_CARE_PROVIDER_SITE_OTHER): Payer: BLUE CROSS/BLUE SHIELD

## 2018-11-23 DIAGNOSIS — E782 Mixed hyperlipidemia: Secondary | ICD-10-CM

## 2018-11-23 LAB — HEPATIC FUNCTION PANEL
ALBUMIN: 4.4 g/dL (ref 3.5–5.2)
ALT: 68 U/L — ABNORMAL HIGH (ref 0–35)
AST: 44 U/L — AB (ref 0–37)
Alkaline Phosphatase: 55 U/L (ref 39–117)
BILIRUBIN TOTAL: 0.5 mg/dL (ref 0.2–1.2)
Bilirubin, Direct: 0.1 mg/dL (ref 0.0–0.3)
Total Protein: 7.1 g/dL (ref 6.0–8.3)

## 2018-11-30 DIAGNOSIS — Z130 Encounter for screening for diseases of the blood and blood-forming organs and certain disorders involving the immune mechanism: Secondary | ICD-10-CM | POA: Diagnosis not present

## 2018-11-30 DIAGNOSIS — Z01419 Encounter for gynecological examination (general) (routine) without abnormal findings: Secondary | ICD-10-CM | POA: Diagnosis not present

## 2018-11-30 DIAGNOSIS — Z6834 Body mass index (BMI) 34.0-34.9, adult: Secondary | ICD-10-CM | POA: Diagnosis not present

## 2018-11-30 DIAGNOSIS — Z780 Asymptomatic menopausal state: Secondary | ICD-10-CM | POA: Diagnosis not present

## 2018-11-30 DIAGNOSIS — R87612 Low grade squamous intraepithelial lesion on cytologic smear of cervix (LGSIL): Secondary | ICD-10-CM | POA: Diagnosis not present

## 2018-11-30 LAB — HM PAP SMEAR: HM PAP: NORMAL

## 2018-12-01 ENCOUNTER — Encounter: Payer: Self-pay | Admitting: Emergency Medicine

## 2018-12-04 ENCOUNTER — Other Ambulatory Visit: Payer: Self-pay | Admitting: Physician Assistant

## 2018-12-04 DIAGNOSIS — I10 Essential (primary) hypertension: Secondary | ICD-10-CM

## 2018-12-09 ENCOUNTER — Encounter: Payer: Self-pay | Admitting: Emergency Medicine

## 2019-01-05 ENCOUNTER — Other Ambulatory Visit (INDEPENDENT_AMBULATORY_CARE_PROVIDER_SITE_OTHER): Payer: Self-pay

## 2019-01-05 DIAGNOSIS — R7989 Other specified abnormal findings of blood chemistry: Secondary | ICD-10-CM

## 2019-01-05 DIAGNOSIS — R945 Abnormal results of liver function studies: Secondary | ICD-10-CM

## 2019-01-06 LAB — HEPATITIS PANEL, ACUTE
HEP A IGM: NONREACTIVE
HEP C AB: NONREACTIVE
Hep B C IgM: NONREACTIVE
Hepatitis B Surface Ag: NONREACTIVE
SIGNAL TO CUT-OFF: 0.01 (ref <1.00)

## 2019-01-11 ENCOUNTER — Other Ambulatory Visit: Payer: Self-pay | Admitting: *Deleted

## 2019-01-11 DIAGNOSIS — R7989 Other specified abnormal findings of blood chemistry: Secondary | ICD-10-CM

## 2019-01-11 DIAGNOSIS — R945 Abnormal results of liver function studies: Principal | ICD-10-CM

## 2019-01-11 NOTE — Progress Notes (Signed)
US ordered

## 2019-01-25 ENCOUNTER — Encounter: Payer: Self-pay | Admitting: Internal Medicine

## 2019-01-25 ENCOUNTER — Other Ambulatory Visit: Payer: Self-pay

## 2019-02-08 ENCOUNTER — Other Ambulatory Visit: Payer: Self-pay

## 2019-03-14 ENCOUNTER — Telehealth: Payer: Self-pay | Admitting: Physician Assistant

## 2019-03-14 NOTE — Telephone Encounter (Signed)
Due to [facility] pt's [place] has got rescheduled to April 26, 2019 she wanted to know if Einar Pheasant thinks this is ok or should she try to get this done sooner rather then later. Please advise.

## 2019-03-15 NOTE — Telephone Encounter (Signed)
LM making her aware of this.

## 2019-03-15 NOTE — Telephone Encounter (Signed)
That should be ok 

## 2019-04-05 ENCOUNTER — Other Ambulatory Visit: Payer: Self-pay

## 2019-04-05 DIAGNOSIS — Z03818 Encounter for observation for suspected exposure to other biological agents ruled out: Secondary | ICD-10-CM | POA: Diagnosis not present

## 2019-04-26 ENCOUNTER — Ambulatory Visit
Admission: RE | Admit: 2019-04-26 | Discharge: 2019-04-26 | Disposition: A | Discharge status: Home / Self Care | Payer: BC Managed Care – PPO | Source: Ambulatory Visit | Attending: Physician Assistant | Admitting: Physician Assistant

## 2019-04-26 DIAGNOSIS — R7989 Other specified abnormal findings of blood chemistry: Secondary | ICD-10-CM

## 2019-04-26 DIAGNOSIS — K760 Fatty (change of) liver, not elsewhere classified: Secondary | ICD-10-CM | POA: Diagnosis not present

## 2019-07-20 ENCOUNTER — Encounter: Payer: Self-pay | Admitting: Internal Medicine

## 2019-08-23 ENCOUNTER — Ambulatory Visit (AMBULATORY_SURGERY_CENTER): Payer: Self-pay | Admitting: *Deleted

## 2019-08-23 ENCOUNTER — Other Ambulatory Visit: Payer: Self-pay

## 2019-08-23 VITALS — Temp 96.6°F | Ht 64.0 in | Wt 194.6 lb

## 2019-08-23 DIAGNOSIS — Z86010 Personal history of colonic polyps: Secondary | ICD-10-CM

## 2019-08-23 NOTE — Progress Notes (Signed)
No egg or soy allergy known to patient  No issues with past sedation with any surgeries  or procedures, no intubation problems  No diet pills per patient No home 02 use per patient  No blood thinners per patient  Pt denies issues with constipation  No A fib or A flutter  EMMI video sent to pt's e mail  Pt has Corona visus antibodies but never had known COVID   Due to the COVID-19 pandemic we are asking patients to follow these guidelines. Please only bring one care partner. Please be aware that your care partner may wait in the car in the parking lot or if they feel like they will be too hot to wait in the car, they may wait in the lobby on the 4th floor. All care partners are required to wear a mask the entire time (we do not have any that we can provide them), they need to practice social distancing, and we will do a Covid check for all patient's and care partners when you arrive. Also we will check their temperature and your temperature. If the care partner waits in their car they need to stay in the parking lot the entire time and we will call them on their cell phone when the patient is ready for discharge so they can bring the car to the front of the building. Also all patient's will need to wear a mask into building.

## 2019-08-24 ENCOUNTER — Encounter: Payer: Self-pay | Admitting: Internal Medicine

## 2019-08-24 ENCOUNTER — Encounter: Payer: Self-pay | Admitting: Physician Assistant

## 2019-09-01 ENCOUNTER — Telehealth: Payer: Self-pay | Admitting: Internal Medicine

## 2019-09-01 NOTE — Telephone Encounter (Signed)

## 2019-09-04 ENCOUNTER — Ambulatory Visit (AMBULATORY_SURGERY_CENTER): Payer: BC Managed Care – PPO | Admitting: Internal Medicine

## 2019-09-04 ENCOUNTER — Encounter: Payer: Self-pay | Admitting: Internal Medicine

## 2019-09-04 ENCOUNTER — Other Ambulatory Visit: Payer: Self-pay

## 2019-09-04 VITALS — BP 105/68 | HR 55 | Temp 98.3°F | Resp 12 | Ht 64.0 in | Wt 194.6 lb

## 2019-09-04 DIAGNOSIS — D129 Benign neoplasm of anus and anal canal: Secondary | ICD-10-CM

## 2019-09-04 DIAGNOSIS — D128 Benign neoplasm of rectum: Secondary | ICD-10-CM

## 2019-09-04 DIAGNOSIS — K621 Rectal polyp: Secondary | ICD-10-CM | POA: Diagnosis not present

## 2019-09-04 DIAGNOSIS — D120 Benign neoplasm of cecum: Secondary | ICD-10-CM | POA: Diagnosis not present

## 2019-09-04 DIAGNOSIS — Z1211 Encounter for screening for malignant neoplasm of colon: Secondary | ICD-10-CM | POA: Diagnosis not present

## 2019-09-04 DIAGNOSIS — Z86010 Personal history of colonic polyps: Secondary | ICD-10-CM

## 2019-09-04 MED ORDER — SODIUM CHLORIDE 0.9 % IV SOLN: 500.0000 mL | Freq: Once | INTRAVENOUS | Status: DC

## 2019-09-04 NOTE — Progress Notes (Signed)
Pt's states no medical or surgical changes since previsit or office visit.  Temp taken by LS VS taken by CW

## 2019-09-04 NOTE — Progress Notes (Signed)
Called to room to assist during endoscopic procedure.  Patient ID and intended procedure confirmed with present staff. Received instructions for my participation in the procedure from the performing physician.  

## 2019-09-04 NOTE — Progress Notes (Signed)
To PACU, VSS. Report to [email or web address] 

## 2019-09-04 NOTE — Op Note (Signed)
[facility] Patient Name: Tanya [name] Procedure Date: 09/04/2019 9:11 AM MRN: [id number] Endoscopist: Gatha Mayer , MD Age: 58 Referring MD:  Date of Birth: 1961-04-18 Gender: Female Account #: [id number] Procedure:                Colonoscopy Indications:              Surveillance: Personal history of adenomatous                            polyps on last colonoscopy > 3 years ago Medicines:                Propofol per Anesthesia, Monitored Anesthesia Care Procedure:                Pre-Anesthesia Assessment:                           - Prior to the procedure, a History and Physical                            was performed, and patient medications and                            allergies were reviewed. The patient's tolerance of                            previous anesthesia was also reviewed. The risks                            and benefits of the procedure and the sedation                            options and risks were discussed with the patient.                            All questions were answered, and informed consent                            was obtained. Prior Anticoagulants: The patient has                            taken no previous anticoagulant or antiplatelet                            agents. ASA Grade Assessment: II - A patient with                            mild systemic disease. After reviewing the risks                            and benefits, the patient was deemed in                            satisfactory condition to undergo the procedure.  After obtaining informed consent, the colonoscope                            was passed under direct vision. Throughout the                            procedure, the patient's blood pressure, pulse, and                            oxygen saturations were monitored continuously. The                            Colonoscope was introduced through the anus and     advanced to the the cecum, identified by                            appendiceal orifice and ileocecal valve. The                            colonoscopy was performed without difficulty. The                            patient tolerated the procedure well. The quality                            of the bowel preparation was good. The ileocecal                            valve, appendiceal orifice, and rectum were                            photographed. The bowel preparation used was                            Miralax via split dose instruction. Scope In: 9:20:17 AM Scope Out: 9:37:04 AM Scope Withdrawal Time: 0 hours 14 minutes 21 seconds  Total Procedure Duration: 0 hours 16 minutes 47 seconds  Findings:                 The perianal and digital rectal examinations were                            normal.                           Three flat and sessile polyps were found in the                            rectum and cecum. The polyps were 3 to 10 mm in                            size. These polyps were removed with a cold snare.  Resection and retrieval were complete. Verification                            of patient identification for the specimen was                            done. Estimated blood loss was minimal.                           A 1 mm polyp was found in the cecum. The polyp was                            sessile. The polyp was removed with a cold biopsy                            forceps. Resection and retrieval were complete.                            Verification of patient identification for the                            specimen was done. Estimated blood loss was minimal.                           The exam was otherwise without abnormality on                            direct and retroflexion views. Complications:            No immediate complications. Estimated Blood Loss:     Estimated blood loss was minimal. Impression:               -  Three 3 to 10 mm polyps in the rectum and in the                            cecum, removed with a cold snare. Resected and                            retrieved.                           - One 1 mm polyp in the cecum, removed with a cold                            biopsy forceps. Resected and retrieved.                           - The examination was otherwise normal on direct                            and retroflexion views.                           - Personal history of colonic polyp 10 mm adenoma  11/2015 Recommendation:           - Patient has a contact number available for                            emergencies. The signs and symptoms of potential                            delayed complications were discussed with the                            patient. Return to normal activities tomorrow.                            Written discharge instructions were provided to the                            patient.                           - Resume previous diet.                           - Continue present medications.                           - Repeat colonoscopy is recommended for                            surveillance. The colonoscopy date will be                            determined after pathology results from today's                            exam become available for review. Gatha Mayer, MD 09/04/2019 9:44:46 AM This report has been signed electronically.

## 2019-09-04 NOTE — Patient Instructions (Addendum)
I found and removed 4 polyps - I will let you know pathology results and when to have another routine colonoscopy by mail and/or My Chart.  I appreciate the opportunity to care for you. Gatha Mayer, MD, [facility]   Information on polyps given to you today.  Await pathology results.  YOU HAD AN ENDOSCOPIC PROCEDURE TODAY AT [facility]:   Refer to the procedure report that was given to you for any specific questions about what was found during the examination.  If the procedure report does not answer your questions, please call your gastroenterologist to clarify.  If you requested that your care partner not be given the details of your procedure findings, then the procedure report has been included in a sealed envelope for you to review at your convenience later.  YOU SHOULD EXPECT: Some feelings of bloating in the abdomen. Passage of more gas than usual.  Walking can help get rid of the air that was put into your GI tract during the procedure and reduce the bloating. If you had a lower endoscopy (such as a colonoscopy or flexible sigmoidoscopy) you may notice spotting of blood in your stool or on the toilet paper. If you underwent a bowel prep for your procedure, you may not have a normal bowel movement for a few days.  Please Note:  You might notice some irritation and congestion in your nose or some drainage.  This is from the oxygen used during your procedure.  There is no need for concern and it should clear up in a day or so.  SYMPTOMS TO REPORT IMMEDIATELY:   Following lower endoscopy (colonoscopy or flexible sigmoidoscopy):  Excessive amounts of blood in the stool  Significant tenderness or worsening of abdominal pains  Swelling of the abdomen that is new, acute  Fever of 100F or higher   For urgent or emergent issues, a gastroenterologist can be reached at any hour by calling [phone].   DIET:  We do recommend a small meal at first, but then you may proceed  to your regular diet.  Drink plenty of fluids but you should avoid alcoholic beverages for 24 hours.  ACTIVITY:  You should plan to take it easy for the rest of today and you should NOT DRIVE or use heavy machinery until tomorrow (because of the sedation medicines used during the test).    FOLLOW UP: Our staff will call the number listed on your records 48-72 hours following your procedure to check on you and address any questions or concerns that you may have regarding the information given to you following your procedure. If we do not reach you, we will leave a message.  We will attempt to reach you two times.  During this call, we will ask if you have developed any symptoms of COVID 19. If you develop any symptoms (ie: fever, flu-like symptoms, shortness of breath, cough etc.) before then, please call [phone].  If you test positive for Covid 19 in the 2 weeks post procedure, please call and report this information to [place].    If any biopsies were taken you will be contacted by phone or by letter within the next 1-3 weeks.  Please call us at [phone] if you have not heard about the biopsies in 3 weeks.    SIGNATURES/CONFIDENTIALITY: You and/or your care partner have signed paperwork which will be entered into your electronic medical record.  These signatures attest to the fact that that the information above  on your After Visit Summary has been reviewed and is understood.  Full responsibility of the confidentiality of this discharge information lies with you and/or your care-partner.

## 2019-09-06 ENCOUNTER — Telehealth: Payer: Self-pay

## 2019-09-06 NOTE — Telephone Encounter (Signed)
  Follow up Call-  Call back number 09/04/2019  Post procedure Call Back phone  # [phone]  Permission to leave phone message Yes  Some recent data might be hidden     Patient questions:  Do you have a fever, pain , or abdominal swelling? No. Pain Score  0 *  Have you tolerated food without any problems? Yes.    Have you been able to return to your normal activities? Yes.    Do you have any questions about your discharge instructions: Diet   No. Medications  No. Follow up visit  No.  Do you have questions or concerns about your Care? No.  Actions: * If pain score is 4 or above: No action needed, pain <4.  1. Have you developed a fever since your procedure? no  2.   Have you had an respiratory symptoms (SOB or cough) since your procedure? no  3.   Have you tested positive for COVID 19 since your procedure no  4.   Have you had any family members/close contacts diagnosed with the COVID 19 since your procedure?  no   If yes to any of these questions please route to Joylene John, RN and Alphonsa Gin, Therapist, sports.

## 2019-09-13 DIAGNOSIS — Z1231 Encounter for screening mammogram for malignant neoplasm of breast: Secondary | ICD-10-CM | POA: Diagnosis not present

## 2019-09-13 LAB — HM MAMMOGRAPHY

## 2019-09-18 ENCOUNTER — Encounter: Payer: Self-pay | Admitting: Physician Assistant

## 2019-09-18 ENCOUNTER — Ambulatory Visit (INDEPENDENT_AMBULATORY_CARE_PROVIDER_SITE_OTHER): Payer: BC Managed Care – PPO | Admitting: Physician Assistant

## 2019-09-18 ENCOUNTER — Other Ambulatory Visit: Payer: Self-pay

## 2019-09-18 VITALS — BP 120/80 | HR 59 | Temp 97.6°F | Resp 16 | Ht 64.0 in | Wt 191.0 lb

## 2019-09-18 DIAGNOSIS — R7989 Other specified abnormal findings of blood chemistry: Secondary | ICD-10-CM

## 2019-09-18 DIAGNOSIS — Z23 Encounter for immunization: Secondary | ICD-10-CM | POA: Diagnosis not present

## 2019-09-18 DIAGNOSIS — I10 Essential (primary) hypertension: Secondary | ICD-10-CM | POA: Diagnosis not present

## 2019-09-18 DIAGNOSIS — Z0000 Encounter for general adult medical examination without abnormal findings: Secondary | ICD-10-CM

## 2019-09-18 LAB — CBC WITH DIFFERENTIAL/PLATELET
Basophils Absolute: 0.1 10*3/uL (ref 0.0–0.1)
Basophils Relative: 1.5 % (ref 0.0–3.0)
Eosinophils Absolute: 0 10*3/uL (ref 0.0–0.7)
Eosinophils Relative: 0.8 % (ref 0.0–5.0)
HCT: 39.8 % (ref 36.0–46.0)
Hemoglobin: 13.5 g/dL (ref 12.0–15.0)
Lymphocytes Relative: 34.3 % (ref 12.0–46.0)
Lymphs Abs: 1.7 10*3/uL (ref 0.7–4.0)
MCHC: 33.9 g/dL (ref 30.0–36.0)
MCV: 92.8 fl (ref 78.0–100.0)
Monocytes Absolute: 0.4 10*3/uL (ref 0.1–1.0)
Monocytes Relative: 8.2 % (ref 3.0–12.0)
Neutro Abs: 2.8 10*3/uL (ref 1.4–7.7)
Neutrophils Relative %: 55.2 % (ref 43.0–77.0)
Platelets: 239 10*3/uL (ref 150.0–400.0)
RBC: 4.29 Mil/uL (ref 3.87–5.11)
RDW: 13.3 % (ref 11.5–15.5)
WBC: 5 10*3/uL (ref 4.0–10.5)

## 2019-09-18 LAB — LIPID PANEL
Cholesterol: 231 mg/dL — ABNORMAL HIGH (ref 0–200)
HDL: 72.9 mg/dL (ref >39.00)
NonHDL: 157.93
Total CHOL/HDL Ratio: 3
Triglycerides: 202 mg/dL — ABNORMAL HIGH (ref 0.0–149.0)
VLDL: 40.4 mg/dL — ABNORMAL HIGH (ref 0.0–40.0)

## 2019-09-18 LAB — LDL CHOLESTEROL, DIRECT: Direct LDL: 133 mg/dL

## 2019-09-18 LAB — COMPREHENSIVE METABOLIC PANEL
ALT: 32 U/L (ref 0–35)
AST: 22 U/L (ref 0–37)
Albumin: 4.7 g/dL (ref 3.5–5.2)
Alkaline Phosphatase: 68 U/L (ref 39–117)
BUN: 17 mg/dL (ref 6–23)
CO2: 29 mEq/L (ref 19–32)
Calcium: 10 mg/dL (ref 8.4–10.5)
Chloride: 101 mEq/L (ref 96–112)
Creatinine, Ser: 0.84 mg/dL (ref 0.40–1.20)
GFR: 69.7 mL/min (ref >60.00)
Glucose, Bld: 119 mg/dL — ABNORMAL HIGH (ref 70–99)
Potassium: 4.2 mEq/L (ref 3.5–5.1)
Sodium: 139 mEq/L (ref 135–145)
Total Bilirubin: 0.5 mg/dL (ref 0.2–1.2)
Total Protein: 7.1 g/dL (ref 6.0–8.3)

## 2019-09-18 LAB — HEMOGLOBIN A1C: Hgb A1c MFr Bld: 6 % (ref 4.6–6.5)

## 2019-09-18 NOTE — Progress Notes (Signed)
Patient presents to clinic today for annual exam.  Patient is fasting for labs.  Diet-- well-balanced diet, consumes water, tea and milk throughout the day. Refrains from soda Exercise-- walking almost everyday, playing tennis  Acute Concerns: No acute concerns.   Chronic Issues: Hypertension-- Losartan 25 mg QD, taking as directed, tolerating well. Denies frequent headaches, changes in vision, dizziness, lightheadedness.   BP Readings from Last 3 Encounters:  09/18/19 120/80  09/04/19 105/68  09/14/18 110/82   Health Maintenance: Immunizations -- Agrees to flu shot today.  Colonoscopy -- UTD Mammogram -- UTD; will need to obtain records.  PAP -- UTD HIV Screening -- UTD; declines need to re-screen.  Past Medical History:  Diagnosis Date  . Allergy   . Asthma    child hood asthma  ONLY   . History of chickenpox   . Hx of adenomatous polyp of colon 11/27/2015  . Hypertension    under control with meds   . Plantar fasciitis   . Vaginal tear resulting from childbirth    occ will bleed after large bm with this     Past Surgical History:  Procedure Laterality Date  . CESAREAN SECTION  1997  . COLONOSCOPY    . VAGINAL DELIVERY     x2  . WISDOM TOOTH EXTRACTION      Current Outpatient Medications on File Prior to Visit  Medication Sig Dispense Refill  . aspirin EC 81 MG tablet Take 81 mg by mouth daily.    . Calcium-Magnesium-Vitamin D (CALCIUM MAGNESIUM PO) Take by mouth.    . cholecalciferol (VITAMIN D) 1000 UNITS tablet Take 1,000 Units by mouth daily.    . fluorouracil (EFUDEX) 5 % cream fluorouracil 5 % topical cream    . losartan (COZAAR) 25 MG tablet TAKE 1 TABLET BY MOUTH EVERY DAY 90 tablet 3  . Multiple Vitamin (MULTIVITAMIN) tablet Take 1 tablet by mouth daily.    . Nutritional Supplements (JUICE PLUS FIBRE PO) Take 1 tablet by mouth daily.    . Omega-3 Fatty Acids (FISH OIL) 1200 MG CAPS Take 1 capsule daily by mouth.     No current  facility-administered medications on file prior to visit.     No Known Allergies  Family History  Problem Relation Age of Onset  . Diabetes Maternal Grandmother   . Colon cancer Neg Hx   . Colon polyps Neg Hx   . Esophageal cancer Neg Hx   . Rectal cancer Neg Hx   . Stomach cancer Neg Hx     Social History   Socioeconomic History  . Marital status: Married    Spouse name: Not on file  . Number of children: Not on file  . Years of education: Not on file  . Highest education level: Not on file  Occupational History  . Not on file  Social Needs  . Financial resource strain: Not on file  . Food insecurity    Worry: Not on file    Inability: Not on file  . Transportation needs    Medical: Not on file    Non-medical: Not on file  Tobacco Use  . Smoking status: Never Smoker  . Smokeless tobacco: Never Used  Substance and Sexual Activity  . Alcohol use: Yes    Alcohol/week: 2.0 standard drinks    Types: 2 Glasses of wine per week    Comment: wine socially  . Drug use: No  . Sexual activity: Yes    Birth control/protection: Post-menopausal  Lifestyle  . Physical activity    Days per week: Not on file    Minutes per session: Not on file  . Stress: Not on file  Relationships  . Social Herbalist on phone: Not on file    Gets together: Not on file    Attends religious service: Not on file    Active member of club or organization: Not on file    Attends meetings of clubs or organizations: Not on file    Relationship status: Not on file  . Intimate partner violence    Fear of current or ex partner: Not on file    Emotionally abused: Not on file    Physically abused: Not on file    Forced sexual activity: Not on file  Other Topics Concern  . Not on file  Social History Narrative  . Not on file   Review of Systems  Constitutional: Negative for chills and fever.  HENT: Negative for hearing loss and tinnitus.   Eyes: Negative for blurred vision and double  vision.  Respiratory: Negative for cough, shortness of breath and wheezing.   Cardiovascular: Negative for chest pain and palpitations.  Gastrointestinal: Negative for blood in stool, constipation, diarrhea, heartburn, melena, nausea and vomiting.  Genitourinary: Negative for dysuria, frequency and urgency.  Musculoskeletal: Negative for falls.  Neurological: Negative for dizziness, weakness and headaches.  Psychiatric/Behavioral: Negative for depression. The patient is not nervous/anxious and does not have insomnia.    Wt 191 lb (86.6 kg)   LMP 08/25/2015 (Approximate)   BMI 32.79 kg/m   Physical Exam Vitals signs reviewed.  Constitutional:      Appearance: Normal appearance.  HENT:     Head: Normocephalic and atraumatic.     Right Ear: Tympanic membrane, ear canal and external ear normal.     Left Ear: Tympanic membrane, ear canal and external ear normal.     Nose: Nose normal. No mucosal edema.     Mouth/Throat:     Pharynx: Uvula midline. No oropharyngeal exudate or posterior oropharyngeal erythema.  Eyes:     Conjunctiva/sclera: Conjunctivae normal.     Pupils: Pupils are equal, round, and reactive to light.  Neck:     Musculoskeletal: Neck supple.     Thyroid: No thyromegaly.  Cardiovascular:     Rate and Rhythm: Normal rate and regular rhythm.     Heart sounds: Normal heart sounds.  Pulmonary:     Effort: Pulmonary effort is normal. No respiratory distress.     Breath sounds: Normal breath sounds. No wheezing or rales.  Abdominal:     General: Bowel sounds are normal. There is no distension.     Palpations: Abdomen is soft. There is no mass.     Tenderness: There is no abdominal tenderness. There is no guarding or rebound.  Lymphadenopathy:     Cervical: No cervical adenopathy.  Skin:    General: Skin is warm and dry.     Findings: No rash.  Neurological:     General: No focal deficit present.     Mental Status: She is alert and oriented to person, place, and  time.     Cranial Nerves: No cranial nerve deficit.  Psychiatric:        Mood and Affect: Mood normal.    Assessment/Plan: 1. Visit for preventive health examination Depression screen negative. Health Maintenance reviewed. Preventive schedule discussed and handout given in AVS. Will obtain fasting labs today. - CBC with Differential/Platelet -  Comprehensive metabolic panel - Hemoglobin A1c - Lipid panel  2. Essential hypertension Taking Losartan as directed. BP normotensive, asymptomatic. Continue current regimen. Labs today. - Comprehensive metabolic panel - Hemoglobin A1c - Lipid panel  3. Elevated LFTs - Comprehensive metabolic panel  4. Need for immunization against influenza - Flu Vaccine QUAD 36+ mos IM   Leeanne Rio, PA-C

## 2019-09-18 NOTE — Patient Instructions (Signed)
Please go to the lab for blood work.   Our office will call you with your results unless you have chosen to receive results via MyChart.  If your blood work is normal we will follow-up each year for physicals and as scheduled for chronic medical problems.  If anything is abnormal we will treat accordingly and get you in for a follow-up.   Preventive Care 40-57 Years Old, Female Preventive care refers to visits with your health care provider and lifestyle choices that can promote health and wellness. This includes:  A yearly physical exam. This may also be called an annual well check.  Regular dental visits and eye exams.  Immunizations.  Screening for certain conditions.  Healthy lifestyle choices, such as eating a healthy diet, getting regular exercise, not using drugs or products that contain nicotine and tobacco, and limiting alcohol use. What can I expect for my preventive care visit? Physical exam Your health care provider will check your:  Height and weight. This may be used to calculate body mass index (BMI), which tells if you are at a healthy weight.  Heart rate and blood pressure.  Skin for abnormal spots. Counseling Your health care provider may ask you questions about your:  Alcohol, tobacco, and drug use.  Emotional well-being.  Home and relationship well-being.  Sexual activity.  Eating habits.  Work and work environment.  Method of birth control.  Menstrual cycle.  Pregnancy history. What immunizations do I need?  Influenza (flu) vaccine  This is recommended every year. Tetanus, diphtheria, and pertussis (Tdap) vaccine  You may need a Td booster every 10 years. Varicella (chickenpox) vaccine  You may need this if you have not been vaccinated. Zoster (shingles) vaccine  You may need this after age 60. Measles, mumps, and rubella (MMR) vaccine  You may need at least one dose of MMR if you were born in 1957 or later. You may also need a  second dose. Pneumococcal conjugate (PCV13) vaccine  You may need this if you have certain conditions and were not previously vaccinated. Pneumococcal polysaccharide (PPSV23) vaccine  You may need one or two doses if you smoke cigarettes or if you have certain conditions. Meningococcal conjugate (MenACWY) vaccine  You may need this if you have certain conditions. Hepatitis A vaccine  You may need this if you have certain conditions or if you travel or work in places where you may be exposed to hepatitis A. Hepatitis B vaccine  You may need this if you have certain conditions or if you travel or work in places where you may be exposed to hepatitis B. Haemophilus influenzae type b (Hib) vaccine  You may need this if you have certain conditions. Human papillomavirus (HPV) vaccine  If recommended by your health care provider, you may need three doses over 6 months. You may receive vaccines as individual doses or as more than one vaccine together in one shot (combination vaccines). Talk with your health care provider about the risks and benefits of combination vaccines. What tests do I need? Blood tests  Lipid and cholesterol levels. These may be checked every 5 years, or more frequently if you are over 50 years old.  Hepatitis C test.  Hepatitis B test. Screening  Lung cancer screening. You may have this screening every year starting at age 57 if you have a 30-pack-year history of smoking and currently smoke or have quit within the past 15 years.  Colorectal cancer screening. All adults should have this screening starting   at age 57 and continuing until age 75. Your health care provider may recommend screening at age 45 if you are at increased risk. You will have tests every 1-10 years, depending on your results and the type of screening test.  Diabetes screening. This is done by checking your blood sugar (glucose) after you have not eaten for a while (fasting). You may have this done  every 1-3 years.  Mammogram. This may be done every 1-2 years. Talk with your health care provider about when you should start having regular mammograms. This may depend on whether you have a family history of breast cancer.  BRCA-related cancer screening. This may be done if you have a family history of breast, ovarian, tubal, or peritoneal cancers.  Pelvic exam and Pap test. This may be done every 3 years starting at age 21. Starting at age 30, this may be done every 5 years if you have a Pap test in combination with an HPV test. Other tests  Sexually transmitted disease (STD) testing.  Bone density scan. This is done to screen for osteoporosis. You may have this scan if you are at high risk for osteoporosis. Follow these instructions at home: Eating and drinking  Eat a diet that includes fresh fruits and vegetables, whole grains, lean protein, and low-fat dairy.  Take vitamin and mineral supplements as recommended by your health care provider.  Do not drink alcohol if: ? Your health care provider tells you not to drink. ? You are pregnant, may be pregnant, or are planning to become pregnant.  If you drink alcohol: ? Limit how much you have to 0-1 drink a day. ? Be aware of how much alcohol is in your drink. In the [place] one drink equals one 12 oz bottle of beer (355 mL), one 5 oz glass of wine (148 mL), or one 1 oz glass of hard liquor (44 mL). Lifestyle  Take daily care of your teeth and gums.  Stay active. Exercise for at least 30 minutes on 5 or more days each week.  Do not use any products that contain nicotine or tobacco, such as cigarettes, e-cigarettes, and chewing tobacco. If you need help quitting, ask your health care provider.  If you are sexually active, practice safe sex. Use a condom or other form of birth control (contraception) in order to prevent pregnancy and STIs (sexually transmitted infections).  If told by your health care provider, take low-dose aspirin  daily starting at age 57. What's next?  Visit your health care provider once a year for a well check visit.  Ask your health care provider how often you should have your eyes and teeth checked.  Stay up to date on all vaccines. This information is not intended to replace advice given to you by your health care provider. Make sure you discuss any questions you have with your health care provider. Document Released: 11/29/2015 Document Revised: 07/14/2018 Document Reviewed: 07/14/2018 Elsevier Patient Education  2020 Elsevier Inc. .      

## 2019-09-21 ENCOUNTER — Encounter: Payer: Self-pay | Admitting: Internal Medicine

## 2019-09-21 NOTE — Progress Notes (Signed)
Adenoma + ssp's max 10 mm - recall 2023 My Chart letter

## 2019-12-03 ENCOUNTER — Other Ambulatory Visit: Payer: Self-pay | Admitting: Physician Assistant

## 2019-12-03 DIAGNOSIS — I10 Essential (primary) hypertension: Secondary | ICD-10-CM

## 2020-01-10 DIAGNOSIS — Z780 Asymptomatic menopausal state: Secondary | ICD-10-CM | POA: Diagnosis not present

## 2020-01-10 DIAGNOSIS — Z124 Encounter for screening for malignant neoplasm of cervix: Secondary | ICD-10-CM | POA: Diagnosis not present

## 2020-01-10 DIAGNOSIS — Z01419 Encounter for gynecological examination (general) (routine) without abnormal findings: Secondary | ICD-10-CM | POA: Diagnosis not present

## 2020-01-10 DIAGNOSIS — Z130 Encounter for screening for diseases of the blood and blood-forming organs and certain disorders involving the immune mechanism: Secondary | ICD-10-CM | POA: Diagnosis not present

## 2020-01-10 DIAGNOSIS — Z8742 Personal history of other diseases of the female genital tract: Secondary | ICD-10-CM | POA: Diagnosis not present

## 2020-01-10 DIAGNOSIS — Z1151 Encounter for screening for human papillomavirus (HPV): Secondary | ICD-10-CM | POA: Diagnosis not present

## 2020-01-16 LAB — HM PAP SMEAR: HM Pap smear: ABNORMAL

## 2020-01-26 ENCOUNTER — Encounter: Payer: Self-pay | Admitting: Emergency Medicine

## 2020-02-16 ENCOUNTER — Ambulatory Visit: Payer: BC Managed Care – PPO | Attending: Internal Medicine

## 2020-02-16 DIAGNOSIS — Z23 Encounter for immunization: Secondary | ICD-10-CM

## 2020-02-16 NOTE — Progress Notes (Signed)
   Covid-19 Vaccination Clinic  Name:  Tanya [name]    MRN: [id number] DOB: [birth year]  02/16/2020  [name] was observed post Covid-19 immunization for 15 minutes without incident. She was provided with Vaccine Information Sheet and instruction to access the V-Safe system.   [name] was instructed to call 911 with any severe reactions post vaccine: Marland Kitchen Difficulty breathing  . Swelling of face and throat  . A fast heartbeat  . A bad rash all over body  . Dizziness and weakness   Immunizations Administered    Name Date Dose VIS Date Route   Pfizer COVID-19 Vaccine 02/16/2020  3:13 PM 0.3 mL 10/27/2019 Intramuscular   Manufacturer: [employer], [employer]   Lot: [id number]   [place]: [id number]

## 2020-03-08 ENCOUNTER — Ambulatory Visit: Payer: BC Managed Care – PPO | Attending: Internal Medicine

## 2020-03-08 DIAGNOSIS — Z23 Encounter for immunization: Secondary | ICD-10-CM

## 2020-03-08 NOTE — Progress Notes (Signed)
   Covid-19 Vaccination Clinic  Name:  Tanya [name]    MRN: [id number] DOB: [birth year]  03/08/2020  [name] was observed post Covid-19 immunization for 15 minutes without incident. She was provided with Vaccine Information Sheet and instruction to access the V-Safe system.   [name] was instructed to call 911 with any severe reactions post vaccine: Marland Kitchen Difficulty breathing  . Swelling of face and throat  . A fast heartbeat  . A bad rash all over body  . Dizziness and weakness   Immunizations Administered    Name Date Dose VIS Date Route   Pfizer COVID-19 Vaccine 03/08/2020  2:07 PM 0.3 mL 01/10/2019 Intramuscular   Manufacturer: [place]   Lot: [id number]   [place]: [id number]

## 2020-03-11 ENCOUNTER — Ambulatory Visit: Payer: BC Managed Care – PPO

## 2020-05-10 ENCOUNTER — Telehealth: Payer: Self-pay | Admitting: Physician Assistant

## 2020-05-10 NOTE — Telephone Encounter (Signed)
Please advise 

## 2020-05-10 NOTE — Telephone Encounter (Signed)
Pt called in stating that she just found out that she has a family history of an ascending aortic aneurysm. She states that her morther's was found about 7 yrs ago and her Brother was just recently diagnosed. She states she would like to get tested for this but is not sure what all it entails. Please advise

## 2020-05-15 ENCOUNTER — Other Ambulatory Visit: Payer: Self-pay | Admitting: Physician Assistant

## 2020-05-15 DIAGNOSIS — Z8249 Family history of ischemic heart disease and other diseases of the circulatory system: Secondary | ICD-10-CM

## 2020-05-15 NOTE — Telephone Encounter (Signed)
The phone number that you need to call is [phone]

## 2020-05-15 NOTE — Telephone Encounter (Signed)
Patient is aware. CT has been scheduled. Still awaiting [place] appointment.

## 2020-05-15 NOTE — Telephone Encounter (Signed)
Giving the significant family history in her case, I would recommend we proceed with screening.  If the history is of an ascending thoracic aortic aneurysm then we should certainly image this area but should also assess her abdominal aorta as well.  I have placed orders for her -- Ct Aorta (to assess thoracic aorta) and an abdominal US to assess for AAA.

## 2020-05-15 NOTE — Telephone Encounter (Signed)
Patient called back and said to please call her on [phone].  Thanks

## 2020-05-15 NOTE — Telephone Encounter (Signed)
Called patient but unable to leave a vm message because phone just keeps ringing. Will call back later.

## 2020-05-29 ENCOUNTER — Ambulatory Visit
Admission: RE | Admit: 2020-05-29 | Discharge: 2020-05-29 | Disposition: A | Discharge status: Home / Self Care | Payer: BC Managed Care – PPO | Source: Ambulatory Visit | Attending: Physician Assistant | Admitting: Physician Assistant

## 2020-05-29 ENCOUNTER — Other Ambulatory Visit: Payer: Self-pay

## 2020-05-29 ENCOUNTER — Other Ambulatory Visit: Payer: Self-pay | Admitting: Physician Assistant

## 2020-05-29 DIAGNOSIS — Z136 Encounter for screening for cardiovascular disorders: Secondary | ICD-10-CM | POA: Diagnosis not present

## 2020-05-29 DIAGNOSIS — I712 Thoracic aortic aneurysm, without rupture: Secondary | ICD-10-CM | POA: Diagnosis not present

## 2020-05-29 DIAGNOSIS — Z8249 Family history of ischemic heart disease and other diseases of the circulatory system: Secondary | ICD-10-CM

## 2020-05-29 MED ORDER — IOPAMIDOL (ISOVUE-370) INJECTION 76%
75.0000 mL | Freq: Once | INTRAVENOUS | Status: AC | PRN
  Administered 2020-05-29: 75 mL via INTRAVENOUS

## 2020-07-30 DIAGNOSIS — H52203 Unspecified astigmatism, bilateral: Secondary | ICD-10-CM | POA: Diagnosis not present

## 2020-07-30 DIAGNOSIS — H10413 Chronic giant papillary conjunctivitis, bilateral: Secondary | ICD-10-CM | POA: Diagnosis not present

## 2020-07-30 DIAGNOSIS — H5213 Myopia, bilateral: Secondary | ICD-10-CM | POA: Diagnosis not present

## 2020-09-23 ENCOUNTER — Encounter: Payer: Self-pay | Admitting: Physician Assistant

## 2020-09-23 ENCOUNTER — Ambulatory Visit (INDEPENDENT_AMBULATORY_CARE_PROVIDER_SITE_OTHER): Payer: BC Managed Care – PPO | Admitting: Physician Assistant

## 2020-09-23 ENCOUNTER — Other Ambulatory Visit: Payer: Self-pay

## 2020-09-23 VITALS — BP 122/70 | HR 74 | Temp 98.1°F | Resp 14 | Ht 64.0 in | Wt 194.0 lb

## 2020-09-23 DIAGNOSIS — I10 Essential (primary) hypertension: Secondary | ICD-10-CM

## 2020-09-23 DIAGNOSIS — E669 Obesity, unspecified: Secondary | ICD-10-CM

## 2020-09-23 DIAGNOSIS — Z1231 Encounter for screening mammogram for malignant neoplasm of breast: Secondary | ICD-10-CM

## 2020-09-23 DIAGNOSIS — Z23 Encounter for immunization: Secondary | ICD-10-CM

## 2020-09-23 DIAGNOSIS — Z0000 Encounter for general adult medical examination without abnormal findings: Secondary | ICD-10-CM | POA: Diagnosis not present

## 2020-09-23 LAB — COMPREHENSIVE METABOLIC PANEL
ALT: 49 U/L — ABNORMAL HIGH (ref 0–35)
AST: 29 U/L (ref 0–37)
Albumin: 4.6 g/dL (ref 3.5–5.2)
Alkaline Phosphatase: 67 U/L (ref 39–117)
BUN: 17 mg/dL (ref 6–23)
CO2: 28 mEq/L (ref 19–32)
Calcium: 9.7 mg/dL (ref 8.4–10.5)
Chloride: 102 mEq/L (ref 96–112)
Creatinine, Ser: 0.83 mg/dL (ref 0.40–1.20)
GFR: 77.52 mL/min (ref >60.00)
Glucose, Bld: 121 mg/dL — ABNORMAL HIGH (ref 70–99)
Potassium: 4.2 mEq/L (ref 3.5–5.1)
Sodium: 139 mEq/L (ref 135–145)
Total Bilirubin: 0.6 mg/dL (ref 0.2–1.2)
Total Protein: 7 g/dL (ref 6.0–8.3)

## 2020-09-23 LAB — CBC WITH DIFFERENTIAL/PLATELET
Basophils Absolute: 0.1 10*3/uL (ref 0.0–0.1)
Basophils Relative: 1.4 % (ref 0.0–3.0)
Eosinophils Absolute: 0.1 10*3/uL (ref 0.0–0.7)
Eosinophils Relative: 1 % (ref 0.0–5.0)
HCT: 39.2 % (ref 36.0–46.0)
Hemoglobin: 13.4 g/dL (ref 12.0–15.0)
Lymphocytes Relative: 30 % (ref 12.0–46.0)
Lymphs Abs: 1.5 10*3/uL (ref 0.7–4.0)
MCHC: 34.2 g/dL (ref 30.0–36.0)
MCV: 92.7 fl (ref 78.0–100.0)
Monocytes Absolute: 0.5 10*3/uL (ref 0.1–1.0)
Monocytes Relative: 8.8 % (ref 3.0–12.0)
Neutro Abs: 3 10*3/uL (ref 1.4–7.7)
Neutrophils Relative %: 58.8 % (ref 43.0–77.0)
Platelets: 231 10*3/uL (ref 150.0–400.0)
RBC: 4.23 Mil/uL (ref 3.87–5.11)
RDW: 12.6 % (ref 11.5–15.5)
WBC: 5.1 10*3/uL (ref 4.0–10.5)

## 2020-09-23 LAB — LIPID PANEL
Cholesterol: 210 mg/dL — ABNORMAL HIGH (ref 0–200)
HDL: 70.6 mg/dL (ref >39.00)
LDL Cholesterol: 109 mg/dL — ABNORMAL HIGH (ref 0–99)
NonHDL: 139.75
Total CHOL/HDL Ratio: 3
Triglycerides: 153 mg/dL — ABNORMAL HIGH (ref 0.0–149.0)
VLDL: 30.6 mg/dL (ref 0.0–40.0)

## 2020-09-23 LAB — HEMOGLOBIN A1C: Hgb A1c MFr Bld: 6.5 % (ref 4.6–6.5)

## 2020-09-23 LAB — TSH: TSH: 2.56 u[IU]/mL (ref 0.35–4.50)

## 2020-09-23 NOTE — Progress Notes (Signed)
Patient presents to clinic today for annual exam.  Patient is fasting for labs.  Diet -- Endorses well-balanced diet overall. Limiting snacking. Trying to keep a lower-carb diet.   Exercise -- Playing tennis a few times per week. Trying to get back in the [facility] safely. [place] in the neighborhood.   Chronic Issues: Hypertension -- Patient currently on a regimen of losartan 25 mg daily. Endorses taking as directed and tolerating well. Patient denies chest pain, palpitations, lightheadedness, dizziness, vision changes or frequent headaches.  BP Readings from Last 3 Encounters:  09/23/20 122/70  09/18/19 120/80  09/04/19 105/68   Obesity -- Working hard on exercise regimen..  Body mass index is 33.3 kg/m.  Health Maintenance: Immunizations -- Would like annual flu shot today. Would also like to discuss Shingrix. TDaP up-to-date.  Colon Cancer Screening -- up-to-date. Recall 2023.  Mammogram -- Due. Agrees to Screening.  PAP -- up-to-date. Recall 2024.   HIV/Hep C Screening -- Hep C screening completed. Declines HIV screening.   Past Medical History:  Diagnosis Date  . Allergy   . Asthma    child hood asthma  ONLY   . History of chickenpox   . Hx of adenomatous polyp of colon 11/27/2015  . Hypertension    under control with meds   . Plantar fasciitis   . Vaginal tear resulting from childbirth    occ will bleed after large bm with this     Past Surgical History:  Procedure Laterality Date  . CESAREAN SECTION  1997  . COLONOSCOPY    . VAGINAL DELIVERY     x2  . WISDOM TOOTH EXTRACTION      Current Outpatient Medications on File Prior to Visit  Medication Sig Dispense Refill  . aspirin EC 81 MG tablet Take 81 mg by mouth daily.    . Calcium-Magnesium-Vitamin D (CALCIUM MAGNESIUM PO) Take by mouth.    . cholecalciferol (VITAMIN D) 1000 UNITS tablet Take 1,000 Units by mouth daily.    . fluorouracil (EFUDEX) 5 % cream fluorouracil 5 % topical cream    . losartan  (COZAAR) 25 MG tablet TAKE 1 TABLET BY MOUTH EVERY DAY 90 tablet 3  . Multiple Vitamin (MULTIVITAMIN) tablet Take 1 tablet by mouth daily.    . Nutritional Supplements (JUICE PLUS FIBRE PO) Take 1 tablet by mouth daily.    . Omega-3 Fatty Acids (FISH OIL) 1200 MG CAPS Take 1 capsule daily by mouth.     No current facility-administered medications on file prior to visit.    No Known Allergies  Family History  Problem Relation Age of Onset  . Aortic aneurysm Mother   . Diabetes Maternal Grandmother   . Aortic aneurysm Brother   . Colon cancer Neg Hx   . Colon polyps Neg Hx   . Esophageal cancer Neg Hx   . Rectal cancer Neg Hx   . Stomach cancer Neg Hx     Social History   Socioeconomic History  . Marital status: Married    Spouse name: Not on file  . Number of children: Not on file  . Years of education: Not on file  . Highest education level: Not on file  Occupational History  . Not on file  Tobacco Use  . Smoking status: Never Smoker  . Smokeless tobacco: Never Used  Vaping Use  . Vaping Use: Never used  Substance and Sexual Activity  . Alcohol use: Yes    Alcohol/week: 2.0 standard drinks  Types: 2 Glasses of wine per week    Comment: wine socially  . Drug use: No  . Sexual activity: Yes    Birth control/protection: Post-menopausal  Other Topics Concern  . Not on file  Social History Narrative  . Not on file   Social Determinants of Health   Financial Resource Strain:   . Difficulty of Paying Living Expenses: Not on file  Food Insecurity:   . Worried About Charity fundraiser in the Last Year: Not on file  . Ran Out of Food in the Last Year: Not on file  Transportation Needs:   . Lack of Transportation (Medical): Not on file  . Lack of Transportation (Non-Medical): Not on file  Physical Activity:   . Days of Exercise per Week: Not on file  . Minutes of Exercise per Session: Not on file  Stress:   . Feeling of Stress : Not on file  Social  Connections:   . Frequency of Communication with Friends and Family: Not on file  . Frequency of Social Gatherings with Friends and Family: Not on file  . Attends Religious Services: Not on file  . Active Member of Clubs or Organizations: Not on file  . Attends Archivist Meetings: Not on file  . Marital Status: Not on file  Intimate Partner Violence:   . Fear of Current or Ex-Partner: Not on file  . Emotionally Abused: Not on file  . Physically Abused: Not on file  . Sexually Abused: Not on file   Review of Systems  Constitutional: Negative for fever and weight loss.  HENT: Negative for ear discharge, ear pain, hearing loss and tinnitus.   Eyes: Negative for blurred vision, double vision, photophobia and pain.  Respiratory: Negative for cough and shortness of breath.   Cardiovascular: Negative for chest pain and palpitations.  Gastrointestinal: Negative for abdominal pain, blood in stool, constipation, diarrhea, heartburn, melena, nausea and vomiting.  Genitourinary: Negative for dysuria, flank pain, frequency, hematuria and urgency.  Musculoskeletal: Negative for falls.  Neurological: Negative for dizziness, loss of consciousness and headaches.  Endo/Heme/Allergies: Negative for environmental allergies.  Psychiatric/Behavioral: Negative for depression, hallucinations, substance abuse and suicidal ideas. The patient is not nervous/anxious and does not have insomnia.    Wt 194 lb (88 kg)   LMP 08/25/2015 (Approximate)   BMI 33.30 kg/m   Physical Exam Vitals reviewed.  HENT:     Head: Normocephalic and atraumatic.     Right Ear: Tympanic membrane, ear canal and external ear normal.     Left Ear: Tympanic membrane, ear canal and external ear normal.     Nose: Nose normal. No mucosal edema.     Mouth/Throat:     Pharynx: Uvula midline. No oropharyngeal exudate or posterior oropharyngeal erythema.  Eyes:     Conjunctiva/sclera: Conjunctivae normal.     Pupils: Pupils  are equal, round, and reactive to light.  Neck:     Thyroid: No thyromegaly.  Cardiovascular:     Rate and Rhythm: Normal rate and regular rhythm.     Heart sounds: Normal heart sounds.  Pulmonary:     Effort: Pulmonary effort is normal. No respiratory distress.     Breath sounds: Normal breath sounds. No wheezing or rales.  Abdominal:     General: Bowel sounds are normal. There is no distension.     Palpations: Abdomen is soft. There is no mass.     Tenderness: There is no abdominal tenderness. There is no guarding or  rebound.  Musculoskeletal:     Cervical back: Neck supple.  Lymphadenopathy:     Cervical: No cervical adenopathy.  Skin:    General: Skin is warm and dry.     Findings: No rash.  Neurological:     Mental Status: She is alert and oriented to person, place, and time.     Cranial Nerves: No cranial nerve deficit.    Assessment/Plan: 1. Visit for preventive health examination Depression screen negative. Health Maintenance reviewed -- Would like flu shot and Shingrix 1 of 2. . Preventive schedule discussed and handout given in AVS. Will obtain fasting labs today.  2. Breast cancer screening by mammogram Recently had at [facility]. Records requested.   3. Obesity with body mass index 30 or greater Labs today to risk stratify. Stays quite active. Dietary recommendations reviewed. Will monitor.  - Comprehensive metabolic panel - CBC with Differential/Platelet - Lipid panel - Hemoglobin A1c - TSH  4. Essential hypertension Stable. Asymptomatic. Labs today. Continue current regimen.  - Comprehensive metabolic panel - Lipid panel  This visit occurred during the SARS-CoV-2 public health emergency.  Safety protocols were in place, including screening questions prior to the visit, additional usage of staff PPE, and extensive cleaning of exam room while observing appropriate contact time as indicated for disinfecting solutions.    Leeanne Rio, PA-C

## 2020-09-23 NOTE — Patient Instructions (Signed)
Please go to the lab for blood work.   Our office will call you with your results unless you have chosen to receive results via MyChart.  If your blood work is normal we will follow-up each year for physicals and as scheduled for chronic medical problems.  If anything is abnormal we will treat accordingly and get you in for a follow-up.  Please continue current medication regimen. You will need to get your second shingles vaccine 2-6 months from today. Not sooner or later. The ladies at the front desk can help schedule this..   Preventive Care 58-30 Years Old, Female Preventive care refers to visits with your health care provider and lifestyle choices that can promote health and wellness. This includes:  A yearly physical exam. This may also be called an annual well check.  Regular dental visits and eye exams.  Immunizations.  Screening for certain conditions.  Healthy lifestyle choices, such as eating a healthy diet, getting regular exercise, not using drugs or products that contain nicotine and tobacco, and limiting alcohol use. What can I expect for my preventive care visit? Physical exam Your health care provider will check your:  Height and weight. This may be used to calculate body mass index (BMI), which tells if you are at a healthy weight.  Heart rate and blood pressure.  Skin for abnormal spots. Counseling Your health care provider may ask you questions about your:  Alcohol, tobacco, and drug use.  Emotional well-being.  Home and relationship well-being.  Sexual activity.  Eating habits.  Work and work Statistician.  Method of birth control.  Menstrual cycle.  Pregnancy history. What immunizations do I need?  Influenza (flu) vaccine  This is recommended every year. Tetanus, diphtheria, and pertussis (Tdap) vaccine  You may need a Td booster every 10 years. Varicella (chickenpox) vaccine  You may need this if you have not been vaccinated. Zoster  (shingles) vaccine  You may need this after age 72. Measles, mumps, and rubella (MMR) vaccine  You may need at least one dose of MMR if you were born in 1957 or later. You may also need a second dose. Pneumococcal conjugate (PCV13) vaccine  You may need this if you have certain conditions and were not previously vaccinated. Pneumococcal polysaccharide (PPSV23) vaccine  You may need one or two doses if you smoke cigarettes or if you have certain conditions. Meningococcal conjugate (MenACWY) vaccine  You may need this if you have certain conditions. Hepatitis A vaccine  You may need this if you have certain conditions or if you travel or work in places where you may be exposed to hepatitis A. Hepatitis B vaccine  You may need this if you have certain conditions or if you travel or work in places where you may be exposed to hepatitis B. Haemophilus influenzae type b (Hib) vaccine  You may need this if you have certain conditions. Human papillomavirus (HPV) vaccine  If recommended by your health care provider, you may need three doses over 6 months. You may receive vaccines as individual doses or as more than one vaccine together in one shot (combination vaccines). Talk with your health care provider about the risks and benefits of combination vaccines. What tests do I need? Blood tests  Lipid and cholesterol levels. These may be checked every 5 years, or more frequently if you are over 10 years old.  Hepatitis C test.  Hepatitis B test. Screening  Lung cancer screening. You may have this screening every year starting  at age 53 if you have a 30-pack-year history of smoking and currently smoke or have quit within the past 15 years.  Colorectal cancer screening. All adults should have this screening starting at age 66 and continuing until age 26. Your health care provider may recommend screening at age 31 if you are at increased risk. You will have tests every 1-10 years, depending  on your results and the type of screening test.  Diabetes screening. This is done by checking your blood sugar (glucose) after you have not eaten for a while (fasting). You may have this done every 1-3 years.  Mammogram. This may be done every 1-2 years. Talk with your health care provider about when you should start having regular mammograms. This may depend on whether you have a family history of breast cancer.  BRCA-related cancer screening. This may be done if you have a family history of breast, ovarian, tubal, or peritoneal cancers.  Pelvic exam and Pap test. This may be done every 3 years starting at age 76. Starting at age 70, this may be done every 5 years if you have a Pap test in combination with an HPV test. Other tests  Sexually transmitted disease (STD) testing.  Bone density scan. This is done to screen for osteoporosis. You may have this scan if you are at high risk for osteoporosis. Follow these instructions at home: Eating and drinking  Eat a diet that includes fresh fruits and vegetables, whole grains, lean protein, and low-fat dairy.  Take vitamin and mineral supplements as recommended by your health care provider.  Do not drink alcohol if: ? Your health care provider tells you not to drink. ? You are pregnant, may be pregnant, or are planning to become pregnant.  If you drink alcohol: ? Limit how much you have to 0-1 drink a day. ? Be aware of how much alcohol is in your drink. In the [place] one drink equals one 12 oz bottle of beer (355 mL), one 5 oz glass of wine (148 mL), or one 1 oz glass of hard liquor (44 mL). Lifestyle  Take daily care of your teeth and gums.  Stay active. Exercise for at least 30 minutes on 5 or more days each week.  Do not use any products that contain nicotine or tobacco, such as cigarettes, e-cigarettes, and chewing tobacco. If you need help quitting, ask your health care provider.  If you are sexually active, practice safe sex. Use  a condom or other form of birth control (contraception) in order to prevent pregnancy and STIs (sexually transmitted infections).  If told by your health care provider, take low-dose aspirin daily starting at age 53. What's next?  Visit your health care provider once a year for a well check visit.  Ask your health care provider how often you should have your eyes and teeth checked.  Stay up to date on all vaccines. This information is not intended to replace advice given to you by your health care provider. Make sure you discuss any questions you have with your health care provider. Document Revised: 07/14/2018 Document Reviewed: 07/14/2018 Elsevier Patient Education  2020 [employer].

## 2020-09-23 NOTE — Addendum Note (Signed)
Addended by: Leonidas Romberg on: 09/23/2020 09:21 AM   Modules accepted: Orders

## 2020-09-30 ENCOUNTER — Ambulatory Visit: Payer: Self-pay | Attending: Internal Medicine

## 2020-09-30 DIAGNOSIS — Z23 Encounter for immunization: Secondary | ICD-10-CM

## 2020-09-30 NOTE — Progress Notes (Signed)
   Covid-19 Vaccination [facility]  Name:  Tanya [name]    MRN: [id number] DOB: [birth year]  09/30/2020  [name] was observed post Covid-19 immunization for 15 minutes without incident. She was provided with Vaccine Information Sheet and instruction to access the V-Safe system.   [name] was instructed to call 911 with any severe reactions post vaccine: Marland Kitchen Difficulty breathing  . Swelling of face and throat  . A fast heartbeat  . A bad rash all over body  . Dizziness and weakness   Immunizations Administered    Name Date Dose VIS Date Route   Pfizer COVID-19 Vaccine 09/30/2020  3:39 PM 0.3 mL 09/04/2020 Intramuscular   Manufacturer: [place]   Lot: [id number]   [place]: [id number]

## 2020-11-27 ENCOUNTER — Other Ambulatory Visit: Payer: Self-pay | Admitting: Physician Assistant

## 2020-11-27 DIAGNOSIS — I10 Essential (primary) hypertension: Secondary | ICD-10-CM

## 2021-03-05 ENCOUNTER — Ambulatory Visit (INDEPENDENT_AMBULATORY_CARE_PROVIDER_SITE_OTHER): Payer: 59

## 2021-03-05 ENCOUNTER — Other Ambulatory Visit: Payer: Self-pay

## 2021-03-05 DIAGNOSIS — Z23 Encounter for immunization: Secondary | ICD-10-CM | POA: Diagnosis not present

## 2021-03-05 NOTE — Progress Notes (Signed)
i

## 2021-04-13 IMAGING — CT CT ANGIO CHEST
1 series · 17 of 32 positions shown · IV contrast (APPLIED)
Comparison: Hepatic elastography-04/26/2019

CLINICAL DATA: Family history of thoracic aortic aneurysm.

EXAM:
CT ANGIOGRAPHY CHEST WITH CONTRAST
TECHNIQUE: Multidetector CT imaging of the chest was performed using the
standard protocol during bolus administration of intravenous
contrast. Multiplanar CT image reconstructions and MIPs were
obtained to evaluate the vascular anatomy.
CONTRAST:  75mL Z0VUFN-6HA IOPAMIDOL (Z0VUFN-6HA) INJECTION 76%
Creatinine was obtained on site at [HOSPITAL] at [HOSPITAL].
Results: Creatinine 0.9 mg/dL.

[Series 7: chest angio · axial · 0.83mm/px · z∈[-332,-56]mm · 17 of 100 slices shown]
[im 4/100  lung]
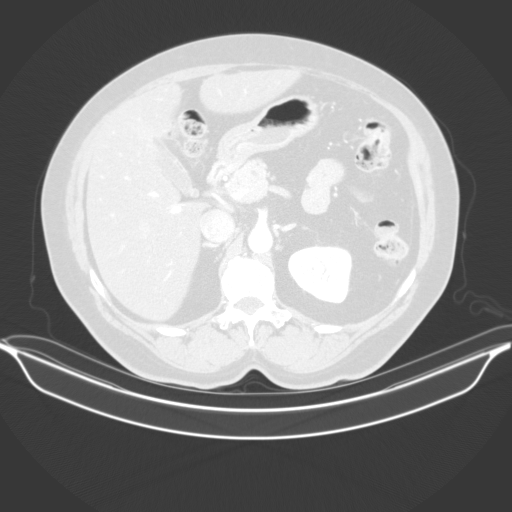
[im 10/100  soft-tissue]
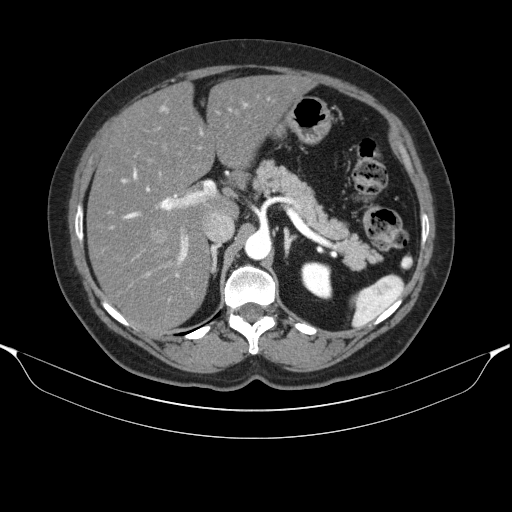
[im 16/100  lung]
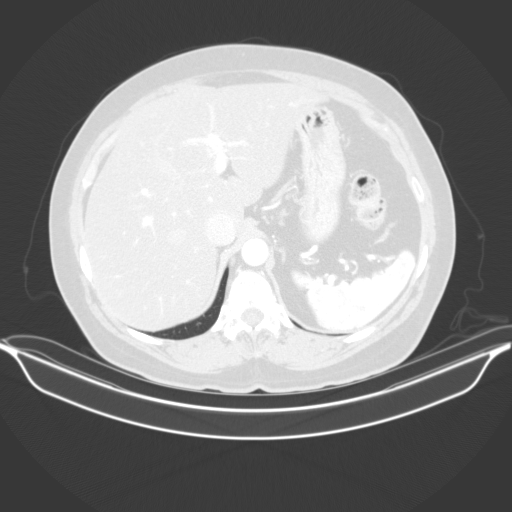
[im 23/100  soft-tissue]
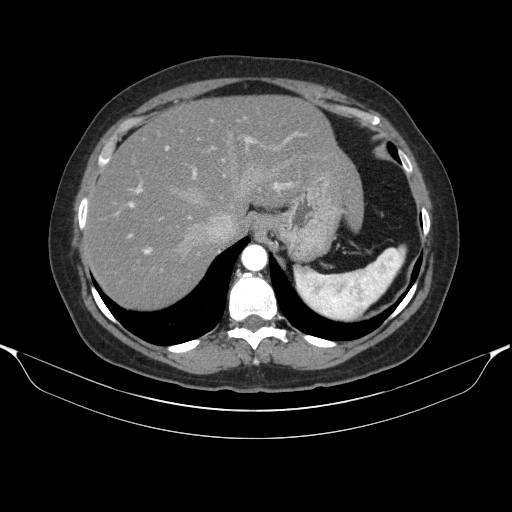
[im 26/100  lung]
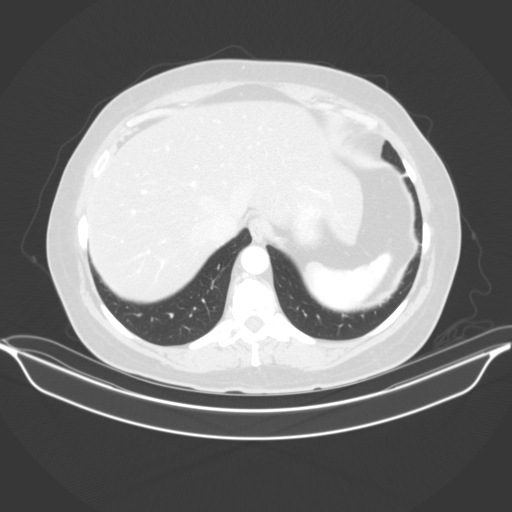
[im 32/100  soft-tissue]
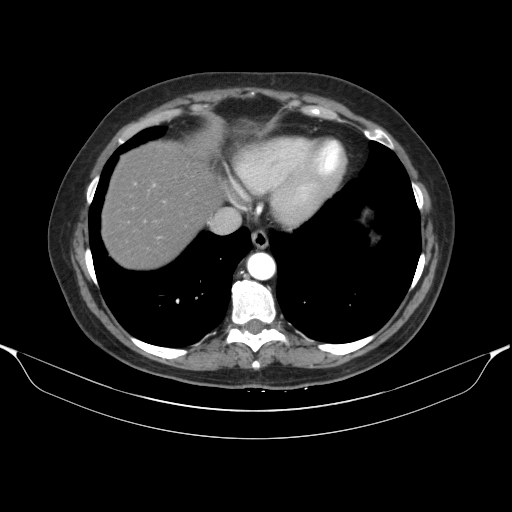
[im 39/100  lung]
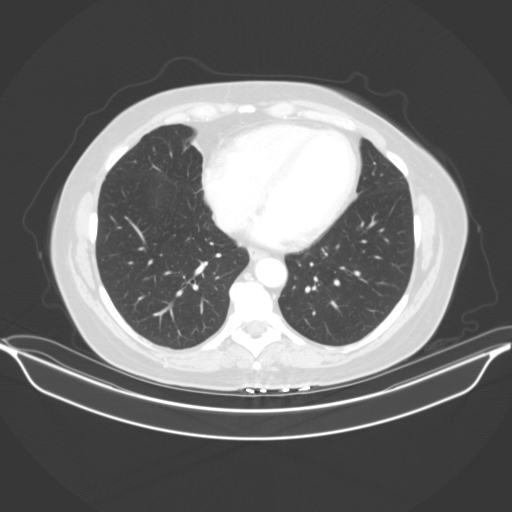
[im 45/100  soft-tissue]
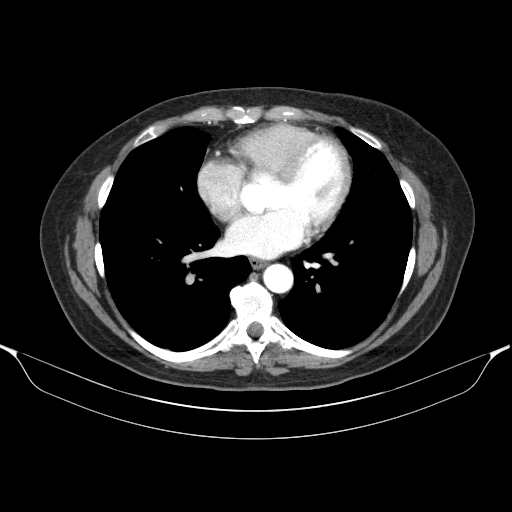
[im 52/100  lung]
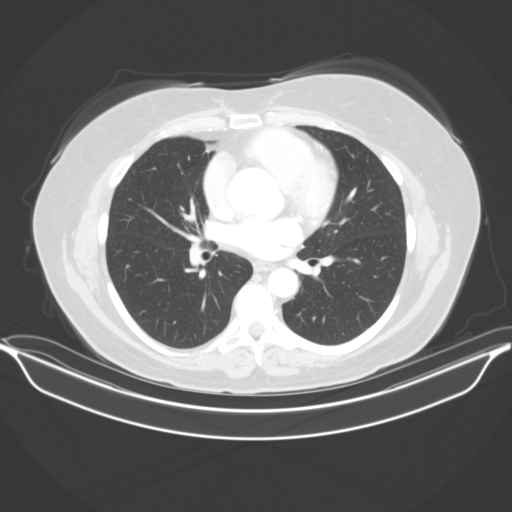
[im 55/100  soft-tissue]
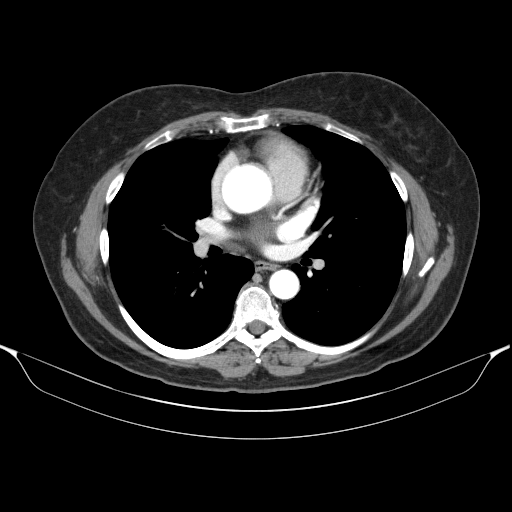
[im 61/100  lung]
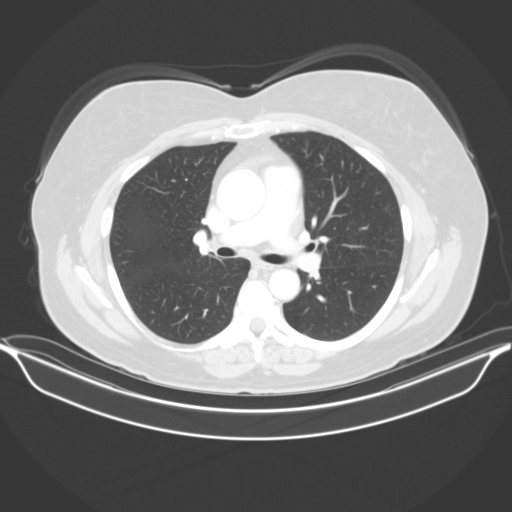
[im 68/100  soft-tissue]
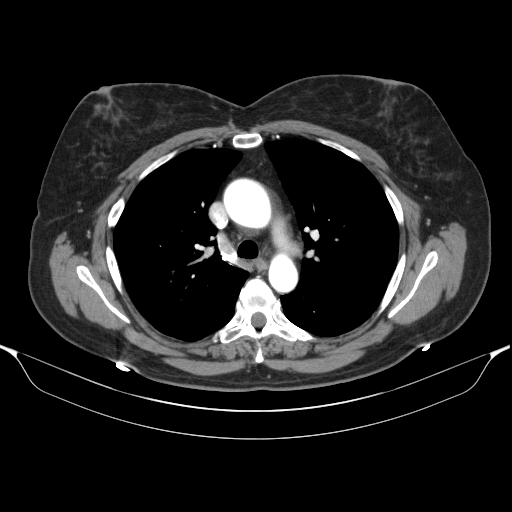
[im 74/100  lung]
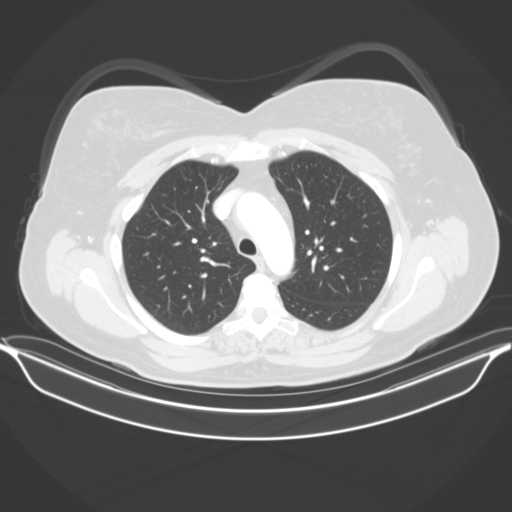
[im 77/100  soft-tissue]
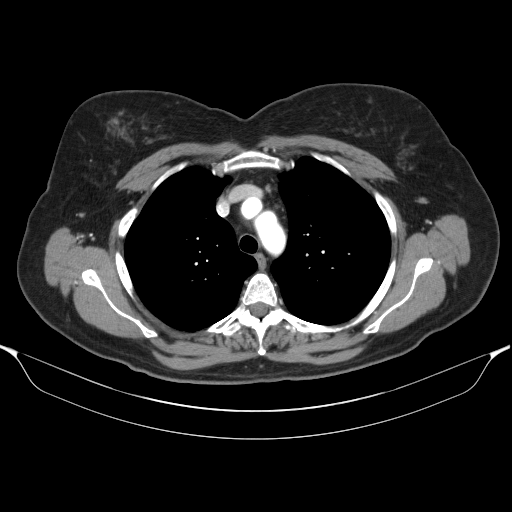
[im 84/100  lung]
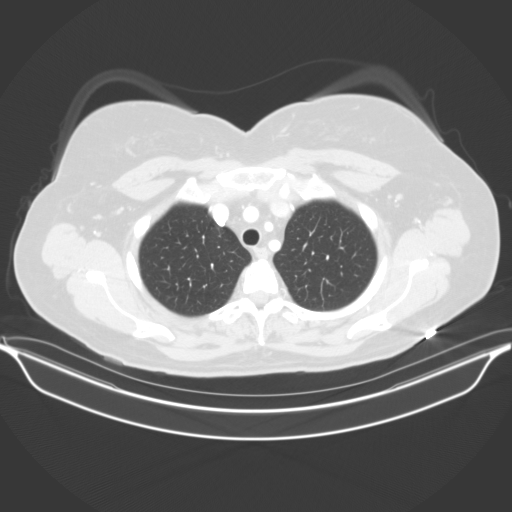
[im 90/100  soft-tissue]
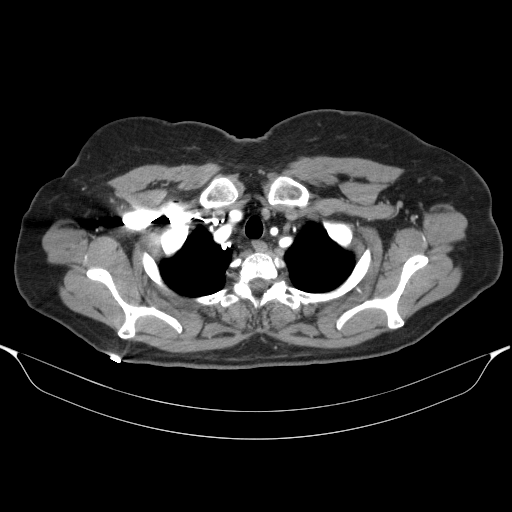
[im 96/100  lung]
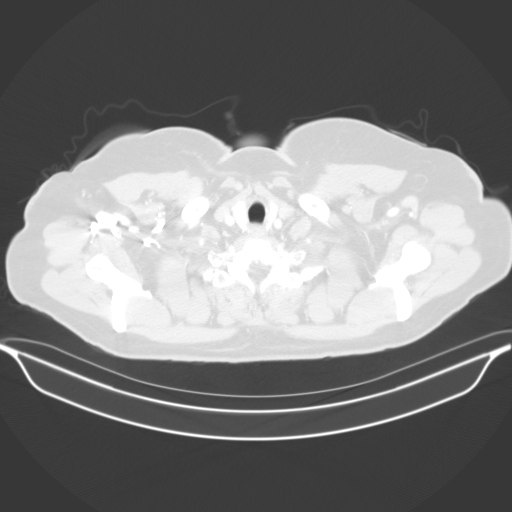

[17 of 32 positions shown; findings below may reference images not displayed]

FINDINGS: Vascular Findings:

Mild fusiform ectasia/borderline aneurysmal dilatation of the
ascending thoracic aorta measuring 39 mm in diameter (image 41,
series 7). The thoracic aorta tapers to a normal caliber at the
level of the aortic arch. The descending thoracic aorta is of normal
caliber. There is no significant atherosclerotic plaque within the
thoracic aorta. No evidence of thoracic aortic dissection or
periaortic stranding on this nongated examination.

Conventional configuration of the aortic arch. The branch vessels of
the aortic arch appear widely patent throughout their imaged
courses.

Normal heart size. No pericardial effusion though small amount of
fluid is seen with the pericardial recess.

Although this examination was not tailored for the evaluation the
pulmonary arteries, there are no discrete filling defects within the
central pulmonary arterial tree to suggest central pulmonary
embolism. Normal caliber of the main pulmonary artery.

-------------------------------------------------------------

Thoracic aortic measurements:

Sinotubular junction

36 mm as measured in greatest oblique short axis coronal dimension.

Proximal ascending aorta

39 mm as measured in greatest oblique short axis axial dimension at
the level of the main pulmonary artery (image 41, series 7) and
approximately 39 mm in greatest oblique short axis coronal diameter
(coronal image 66, series 8).

Aortic arch aorta

27 mm as measured in greatest oblique short axis sagittal dimension.

Proximal descending thoracic aorta

25 mm as measured in greatest oblique short axis axial dimension at
the level of the main pulmonary artery.

Distal descending thoracic aorta

23 mm as measured in greatest oblique short axis axial dimension at
the level of the diaphragmatic hiatus.

Review of the MIP images confirms the above findings.

-------------------------------------------------------------

Non-Vascular Findings:

Mediastinum/Lymph Nodes: No bulky mediastinal, hilar or axillary
adenopathy.

Lungs/Pleura: Minimal subsegmental atelectasis involving the caudal
aspect of the lingula. No discrete focal airspace opacities. No
pleural effusion or pneumothorax. The central pulmonary airways
appear widely patent.

Upper abdomen: Limited early arterial phase evaluation of the upper
abdomen demonstrates diffuse decreased attenuation of the hepatic
parenchyma suggestive of hepatic steatosis. Note is made of a small
splenule about the anterior tip of the spleen.

Musculoskeletal: No acute or aggressive osseous abnormalities.
Regional soft tissues appear normal. Normal appearance of the imaged
portions of the thyroid gland.
IMPRESSION: 1. Uncomplicated mild fusiform ectasia/borderline aneurysmal
dilatation of the ascending thoracic aorta measuring 39 mm in
diameter. Recommend annual imaging followup by CTA or MRA. This
recommendation follows 8898
ACCF/AHA/AATS/ACR/ASA/SCA/ZUMAYA/FAR/ARBER A/DODD Guidelines for the
Diagnosis and Management of Patients with Thoracic Aortic Disease.
Circulation. 8898; 121: E266-e369. Aortic aneurysm NOS (QS9WO-H85.8)
2. Hepatic steatosis, as was questioned on hepatic elastography
performed 04/26/2019.

## 2021-06-27 ENCOUNTER — Encounter: Payer: BC Managed Care – PPO | Admitting: Physician Assistant

## 2021-09-29 ENCOUNTER — Other Ambulatory Visit: Payer: Self-pay

## 2021-09-29 ENCOUNTER — Ambulatory Visit: Payer: 59 | Admitting: Physician Assistant

## 2021-09-29 ENCOUNTER — Encounter: Payer: Self-pay | Admitting: Physician Assistant

## 2021-09-29 VITALS — BP 132/85 | HR 62 | Temp 99.1°F | Ht 64.0 in | Wt 196.2 lb

## 2021-09-29 DIAGNOSIS — E782 Mixed hyperlipidemia: Secondary | ICD-10-CM | POA: Diagnosis not present

## 2021-09-29 DIAGNOSIS — I10 Essential (primary) hypertension: Secondary | ICD-10-CM | POA: Diagnosis not present

## 2021-09-29 DIAGNOSIS — E1165 Type 2 diabetes mellitus with hyperglycemia: Secondary | ICD-10-CM | POA: Diagnosis not present

## 2021-09-29 DIAGNOSIS — E559 Vitamin D deficiency, unspecified: Secondary | ICD-10-CM

## 2021-09-29 NOTE — Progress Notes (Signed)
Subjective:    Patient ID: Tanya [name], female    DOB: [birth year], 60 y.o.   MRN: [id number]  Chief Complaint  Patient presents with   Shoulder Pain    HPI 60 y.o. patient presents today for new patient establishment with me.  Patient was previously established with Tanya Aquas, PA-C.  Current Care Team: GYN - Tanya Lory, NP   Acute Concerns: Needs labs updated No other concerns today  Chronic Concerns: See PMH listed below, as well as A/P for details on issues we specifically discussed during today's visit.     Past Medical History:  Diagnosis Date   Allergy    Asthma    [place] asthma  ONLY    History of chickenpox    Hx of adenomatous polyp of colon 11/27/2015   Hypertension    under control with meds    Plantar fasciitis    Vaginal tear resulting from childbirth    occ will bleed after large bm with this     Past Surgical History:  Procedure Laterality Date   CESAREAN SECTION  1997   COLONOSCOPY     VAGINAL DELIVERY     x2   WISDOM TOOTH EXTRACTION      Family History  Problem Relation Age of Onset   Aortic aneurysm Mother    Diabetes Maternal Grandmother    Aortic aneurysm Brother    Colon cancer Neg Hx    Colon polyps Neg Hx    Esophageal cancer Neg Hx    Rectal cancer Neg Hx    Stomach cancer Neg Hx     Social History   Tobacco Use   Smoking status: Never   Smokeless tobacco: Never  Vaping Use   Vaping Use: Never used  Substance Use Topics   Alcohol use: Yes    Alcohol/week: 2.0 standard drinks    Types: 2 Glasses of wine per week    Comment: wine socially   Drug use: No     No Known Allergies  Review of Systems NEGATIVE UNLESS OTHERWISE INDICATED IN HPI      Objective:     BP 132/85   Pulse 62   Temp 99.1 F (37.3 C)   Ht 5\' 4"  (1.626 m)   Wt 196 lb 3.2 oz (89 kg)   LMP 08/25/2015 (Approximate)   SpO2 97%   BMI 33.68 kg/m   Wt Readings from Last 3 Encounters:  09/29/21 196 lb 3.2 oz (89 kg)  09/23/20 194  lb (88 kg)  09/18/19 191 lb (86.6 kg)    BP Readings from Last 3 Encounters:  09/29/21 132/85  09/23/20 122/70  09/18/19 120/80     Physical Exam Vitals and nursing note reviewed.  Constitutional:      Appearance: Normal appearance. She is obese. She is not toxic-appearing.  HENT:     Head: Normocephalic and atraumatic.     Right Ear: Tympanic membrane, ear canal and external ear normal.     [name] Ear: Tympanic membrane, ear canal and external ear normal.     Nose: Nose normal.     Mouth/Throat:     Mouth: Mucous membranes are moist.  Eyes:     Extraocular Movements: Extraocular movements intact.     Conjunctiva/sclera: Conjunctivae normal.     Pupils: Pupils are equal, round, and reactive to light.  Cardiovascular:     Rate and Rhythm: Normal rate and regular rhythm.     Pulses: Normal pulses.     Heart  sounds: Normal heart sounds.  Pulmonary:     Effort: Pulmonary effort is normal.     Breath sounds: Normal breath sounds.  Abdominal:     General: Abdomen is flat. Bowel sounds are normal.     Palpations: Abdomen is soft.  Musculoskeletal:        General: Normal range of motion.     Cervical back: Normal range of motion and neck supple.  Skin:    General: Skin is warm and dry.  Neurological:     General: No focal deficit present.     Mental Status: She is alert and oriented to person, place, and time.  Psychiatric:        Mood and Affect: Mood normal.        Behavior: Behavior normal.        Thought Content: Thought content normal.        Judgment: Judgment normal.       Assessment & Plan:   Problem List Items Addressed This Visit       Cardiovascular and Mediastinum   Essential hypertension - Primary   Relevant Orders   CBC with Differential/Platelet   Other Visit Diagnoses     Type 2 diabetes mellitus with hyperglycemia, without long-term current use of insulin ([place])       Relevant Orders   CBC with Differential/Platelet   Comprehensive metabolic  panel   Hemoglobin A1c   Mixed hyperlipidemia       Relevant Orders   Lipid panel   Vitamin D deficiency       Relevant Orders   VITAMIN D 25 Hydroxy (Vit-D Deficiency, Fractures)       1. Essential hypertension -Stable, to goal, on Losartan 25 mg daily, not due for refills -DASH diet advised -Increasing exercise also encouraged  2. Type 2 diabetes mellitus with hyperglycemia, without long-term current use of insulin ([place]) - Last Ha1c showed diabetes, she has not had this rechecked since then. Need to recheck and discuss plan if worse. She admits she could be doing better about this issue.   Lab Results  Component Value Date   HGBA1C 6.5 09/23/2020    3. Mixed hyperlipidemia Recheck lipid panel. She is not fasting today and will reschedule labs. Previously couldn't tolerate simvastatin due to muscle aches.  4. Vitamin D deficiency She is taking OTC 1000 iu daily. Recheck level and treat accordingly.   This note was prepared with assistance of Systems analyst. Occasional wrong-word or sound-a-like substitutions may have occurred due to the inherent limitations of voice recognition software.  Time Spent: 33 minutes of total time was spent on the date of the encounter performing the following actions: chart review prior to seeing the patient, obtaining history, performing a medically necessary exam, counseling on the treatment plan, placing orders, and documenting in our EHR.    Hafsa Lohn M Salvadore Valvano, PA-C

## 2021-09-29 NOTE — Patient Instructions (Signed)
Good to meet you today! Please schedule for fasting blood work and I will send results through [facility]. Try to keep working on healthy dietary and lifestyle changes.  Call sooner if any concerns.

## 2021-10-02 ENCOUNTER — Other Ambulatory Visit (INDEPENDENT_AMBULATORY_CARE_PROVIDER_SITE_OTHER): Payer: 59

## 2021-10-02 ENCOUNTER — Other Ambulatory Visit: Payer: Self-pay

## 2021-10-02 DIAGNOSIS — E782 Mixed hyperlipidemia: Secondary | ICD-10-CM

## 2021-10-02 DIAGNOSIS — E1165 Type 2 diabetes mellitus with hyperglycemia: Secondary | ICD-10-CM

## 2021-10-02 DIAGNOSIS — E559 Vitamin D deficiency, unspecified: Secondary | ICD-10-CM

## 2021-10-02 DIAGNOSIS — I10 Essential (primary) hypertension: Secondary | ICD-10-CM

## 2021-10-02 LAB — COMPREHENSIVE METABOLIC PANEL
ALT: 47 U/L — ABNORMAL HIGH (ref 0–35)
AST: 33 U/L (ref 0–37)
Albumin: 4.6 g/dL (ref 3.5–5.2)
Alkaline Phosphatase: 68 U/L (ref 39–117)
BUN: 15 mg/dL (ref 6–23)
CO2: 20 mEq/L (ref 19–32)
Calcium: 9.6 mg/dL (ref 8.4–10.5)
Chloride: 106 mEq/L (ref 96–112)
Creatinine, Ser: 0.92 mg/dL (ref 0.40–1.20)
GFR: 68.02 mL/min (ref >60.00)
Glucose, Bld: 134 mg/dL — ABNORMAL HIGH (ref 70–99)
Potassium: 4.4 mEq/L (ref 3.5–5.1)
Sodium: 142 mEq/L (ref 135–145)
Total Bilirubin: 0.5 mg/dL (ref 0.2–1.2)
Total Protein: 7.3 g/dL (ref 6.0–8.3)

## 2021-10-02 LAB — CBC WITH DIFFERENTIAL/PLATELET
Basophils Absolute: 0 10*3/uL (ref 0.0–0.1)
Basophils Relative: 0.4 % (ref 0.0–3.0)
Eosinophils Absolute: 0.1 10*3/uL (ref 0.0–0.7)
Eosinophils Relative: 1.2 % (ref 0.0–5.0)
HCT: 40.9 % (ref 36.0–46.0)
Hemoglobin: 13.9 g/dL (ref 12.0–15.0)
Lymphocytes Relative: 37 % (ref 12.0–46.0)
Lymphs Abs: 1.9 10*3/uL (ref 0.7–4.0)
MCHC: 34 g/dL (ref 30.0–36.0)
MCV: 93.5 fl (ref 78.0–100.0)
Monocytes Absolute: 0.5 10*3/uL (ref 0.1–1.0)
Monocytes Relative: 10 % (ref 3.0–12.0)
Neutro Abs: 2.7 10*3/uL (ref 1.4–7.7)
Neutrophils Relative %: 51.4 % (ref 43.0–77.0)
Platelets: 223 10*3/uL (ref 150.0–400.0)
RBC: 4.37 Mil/uL (ref 3.87–5.11)
RDW: 13.2 % (ref 11.5–15.5)
WBC: 5.2 10*3/uL (ref 4.0–10.5)

## 2021-10-02 LAB — LIPID PANEL
Cholesterol: 225 mg/dL — ABNORMAL HIGH (ref 0–200)
HDL: 76.6 mg/dL (ref >39.00)
LDL Cholesterol: 124 mg/dL — ABNORMAL HIGH (ref 0–99)
NonHDL: 148.8
Total CHOL/HDL Ratio: 3
Triglycerides: 126 mg/dL (ref 0.0–149.0)
VLDL: 25.2 mg/dL (ref 0.0–40.0)

## 2021-10-02 LAB — HEMOGLOBIN A1C: Hgb A1c MFr Bld: 6.2 % (ref 4.6–6.5)

## 2021-10-02 LAB — VITAMIN D 25 HYDROXY (VIT D DEFICIENCY, FRACTURES): VITD: 40.72 ng/mL (ref 30.00–100.00)

## 2021-12-16 ENCOUNTER — Other Ambulatory Visit: Payer: Self-pay | Admitting: *Deleted

## 2021-12-16 DIAGNOSIS — I10 Essential (primary) hypertension: Secondary | ICD-10-CM

## 2021-12-16 MED ORDER — LOSARTAN POTASSIUM 25 MG PO TABS: 25.0000 mg | ORAL_TABLET | Freq: Every day | ORAL | 3 refills | Status: DC

## 2022-08-10 ENCOUNTER — Encounter: Payer: Self-pay | Admitting: *Deleted

## 2022-09-16 ENCOUNTER — Encounter: Payer: Self-pay | Admitting: Internal Medicine

## 2022-09-16 ENCOUNTER — Encounter: Payer: Self-pay | Admitting: Physician Assistant

## 2022-09-16 ENCOUNTER — Ambulatory Visit (INDEPENDENT_AMBULATORY_CARE_PROVIDER_SITE_OTHER): Payer: 59 | Admitting: Physician Assistant

## 2022-09-16 VITALS — BP 132/82 | HR 71 | Temp 98.2°F | Ht 64.0 in | Wt 193.4 lb

## 2022-09-16 DIAGNOSIS — E782 Mixed hyperlipidemia: Secondary | ICD-10-CM | POA: Diagnosis not present

## 2022-09-16 DIAGNOSIS — Z860101 Personal history of adenomatous and serrated colon polyps: Secondary | ICD-10-CM

## 2022-09-16 DIAGNOSIS — Z0000 Encounter for general adult medical examination without abnormal findings: Secondary | ICD-10-CM

## 2022-09-16 DIAGNOSIS — I10 Essential (primary) hypertension: Secondary | ICD-10-CM

## 2022-09-16 DIAGNOSIS — Z23 Encounter for immunization: Secondary | ICD-10-CM

## 2022-09-16 DIAGNOSIS — E1165 Type 2 diabetes mellitus with hyperglycemia: Secondary | ICD-10-CM | POA: Diagnosis not present

## 2022-09-16 DIAGNOSIS — Z86010 Personal history of colonic polyps: Secondary | ICD-10-CM

## 2022-09-16 LAB — CBC WITH DIFFERENTIAL/PLATELET
Basophils Absolute: 0.1 10*3/uL (ref 0.0–0.1)
Basophils Relative: 1.3 % (ref 0.0–3.0)
Eosinophils Absolute: 0.1 10*3/uL (ref 0.0–0.7)
Eosinophils Relative: 1.4 % (ref 0.0–5.0)
HCT: 41.5 % (ref 36.0–46.0)
Hemoglobin: 13.9 g/dL (ref 12.0–15.0)
Lymphocytes Relative: 36.4 % (ref 12.0–46.0)
Lymphs Abs: 1.7 10*3/uL (ref 0.7–4.0)
MCHC: 33.5 g/dL (ref 30.0–36.0)
MCV: 94.5 fl (ref 78.0–100.0)
Monocytes Absolute: 0.5 10*3/uL (ref 0.1–1.0)
Monocytes Relative: 9.8 % (ref 3.0–12.0)
Neutro Abs: 2.4 10*3/uL (ref 1.4–7.7)
Neutrophils Relative %: 51.1 % (ref 43.0–77.0)
Platelets: 232 10*3/uL (ref 150.0–400.0)
RBC: 4.39 Mil/uL (ref 3.87–5.11)
RDW: 12.9 % (ref 11.5–15.5)
WBC: 4.8 10*3/uL (ref 4.0–10.5)

## 2022-09-16 LAB — COMPREHENSIVE METABOLIC PANEL
ALT: 54 U/L — ABNORMAL HIGH (ref 0–35)
AST: 40 U/L — ABNORMAL HIGH (ref 0–37)
Albumin: 4.4 g/dL (ref 3.5–5.2)
Alkaline Phosphatase: 71 U/L (ref 39–117)
BUN: 16 mg/dL (ref 6–23)
CO2: 28 mEq/L (ref 19–32)
Calcium: 9.6 mg/dL (ref 8.4–10.5)
Chloride: 103 mEq/L (ref 96–112)
Creatinine, Ser: 0.85 mg/dL (ref 0.40–1.20)
GFR: 74.3 mL/min (ref >60.00)
Glucose, Bld: 130 mg/dL — ABNORMAL HIGH (ref 70–99)
Potassium: 4.1 mEq/L (ref 3.5–5.1)
Sodium: 138 mEq/L (ref 135–145)
Total Bilirubin: 0.4 mg/dL (ref 0.2–1.2)
Total Protein: 7.5 g/dL (ref 6.0–8.3)

## 2022-09-16 LAB — LIPID PANEL
Cholesterol: 187 mg/dL (ref 0–200)
HDL: 65.6 mg/dL (ref >39.00)
LDL Cholesterol: 99 mg/dL (ref 0–99)
NonHDL: 121
Total CHOL/HDL Ratio: 3
Triglycerides: 112 mg/dL (ref 0.0–149.0)
VLDL: 22.4 mg/dL (ref 0.0–40.0)

## 2022-09-16 LAB — HEMOGLOBIN A1C: Hgb A1c MFr Bld: 6.3 % (ref 4.6–6.5)

## 2022-09-16 NOTE — Assessment & Plan Note (Signed)
Plan to recheck CMP and A1c today.  Currently diet controlled

## 2022-09-16 NOTE — Assessment & Plan Note (Signed)
Age-appropriate screening and counseling performed today. Will check labs and call with results. Preventive measures discussed and printed in AVS for patient. Flu shot today. GYN annually.  Patient Counseling: '[x]'$   Nutrition: Stressed importance of moderation in sodium/caffeine intake, saturated fat and cholesterol, caloric balance, sufficient intake of fresh fruits, vegetables, and fiber.  '[x]'$   Stressed the importance of regular exercise.   '[x]'$   Substance Abuse: Discussed cessation/primary prevention of tobacco, alcohol, or other drug use; driving or other dangerous activities under the influence; availability of treatment for abuse.   '[]'$   Injury prevention: Discussed safety belts, safety helmets, smoke detector, smoking near bedding or upholstery.   '[]'$   Sexuality: Discussed sexually transmitted diseases, partner selection, use of condoms, avoidance of unintended pregnancy  and contraceptive alternatives.   '[x]'$   Dental health: Discussed importance of regular tooth brushing, flossing, and dental visits.  '[x]'$   Health maintenance and immunizations reviewed. Please refer to Health maintenance section.

## 2022-09-16 NOTE — Assessment & Plan Note (Signed)
Due for colonoscopy - she will call Dr. Celesta Aver office

## 2022-09-16 NOTE — Patient Instructions (Addendum)
Please call [facility] to schedule your colonoscopy. [phone]  Labs and flu shot today  Keep up the great work!

## 2022-09-16 NOTE — Assessment & Plan Note (Signed)
BP stable and to goal Losartan 25 mg daily Monitor at home Low salt diet / exercise

## 2022-09-16 NOTE — Assessment & Plan Note (Signed)
Plan to recheck fasting lipid panel today. She continues to take omega-3 fish oil supplements.

## 2022-09-16 NOTE — Progress Notes (Signed)
Subjective:    Patient ID: Tanya [name], female    DOB: [birth year], 61 y.o.   MRN: [id number]  Chief Complaint  Patient presents with   Annual Exam    Pt in for annual CPE; pt is fasting; pt has no concerns to discuss; pt aware needing to schedule Colonoscopy; pt also scheduled for mammogram on 09/28/22; pt requested flu vaccine today    HPI Patient is in today for annual exam. No changes to medical hx since last visit.  Father-in-law with colorectal cancer, currently living with patient and her husband.   Acute concerns: Just getting over a head cold, but otherwise no concerns   Health maintenance: Lifestyle/ exercise: Enjoys tennis 2-3x weekly Nutrition: Tries to keep diet well-balanced Mental health: Normal life stressors  Sleep: Occasional nighttime awakenings Substance use: None ETOH: 2-3x per week wine glass  Immunizations: Flu shot today  Colonoscopy: Polyp hx, due for repeat colonoscopy  Pap: GYN annually  Mammogram: Scheduled for mammogram    Past Medical History:  Diagnosis Date   Allergy    Asthma    child hood asthma  ONLY    History of chickenpox    Hx of adenomatous polyp of colon 11/27/2015   Hypertension    under control with meds    Plantar fasciitis    Vaginal tear resulting from childbirth    occ will bleed after large bm with this     Past Surgical History:  Procedure Laterality Date   [place]     x2   WISDOM TOOTH EXTRACTION      Family History  Problem Relation Age of Onset   Aortic aneurysm Mother    Diabetes Maternal Grandmother    Aortic aneurysm Brother    Colon cancer Neg Hx    Colon polyps Neg Hx    Esophageal cancer Neg Hx    Rectal cancer Neg Hx    Stomach cancer Neg Hx     Social History   Tobacco Use   Smoking status: Never   Smokeless tobacco: Never  Vaping Use   Vaping Use: Never used  Substance Use Topics   Alcohol use: Yes    Alcohol/week: 2.0 standard  drinks of alcohol    Types: 2 Glasses of wine per week    Comment: wine socially   Drug use: No     No Known Allergies  Review of Systems NEGATIVE UNLESS OTHERWISE INDICATED IN HPI      Objective:     BP 132/82 (BP Location: Right Arm) Comment (BP Location): manually  Pulse 71   Temp 98.2 F (36.8 C) (Temporal)   Ht '5\' 4"'$  (1.626 m)   Wt 193 lb 6.4 oz (87.7 kg)   LMP 08/25/2015 (Approximate)   SpO2 97%   BMI 33.20 kg/m   Wt Readings from Last 3 Encounters:  09/16/22 193 lb 6.4 oz (87.7 kg)  09/29/21 196 lb 3.2 oz (89 kg)  09/23/20 194 lb (88 kg)    BP Readings from Last 3 Encounters:  09/16/22 132/82  09/29/21 132/85  09/23/20 122/70     Physical Exam Vitals and nursing note reviewed.  Constitutional:      Appearance: Normal appearance. She is normal weight. She is not toxic-appearing.  HENT:     Head: Normocephalic and atraumatic.     Right Ear: Tympanic membrane, ear canal and external ear normal.     [name] Ear: Tympanic membrane,  ear canal and external ear normal.     Nose: Nose normal.     Mouth/Throat:     Mouth: Mucous membranes are moist.  Eyes:     Extraocular Movements: Extraocular movements intact.     Conjunctiva/sclera: Conjunctivae normal.     Pupils: Pupils are equal, round, and reactive to light.  Cardiovascular:     Rate and Rhythm: Normal rate and regular rhythm.     Pulses: Normal pulses.     Heart sounds: Normal heart sounds.  Pulmonary:     Effort: Pulmonary effort is normal.     Breath sounds: Normal breath sounds.  Abdominal:     General: Abdomen is flat. Bowel sounds are normal.     Palpations: Abdomen is soft.  Musculoskeletal:        General: Normal range of motion.     Cervical back: Normal range of motion and neck supple.  Skin:    General: Skin is warm and dry.     Comments: Scattered cherry angiomas  Neurological:     General: No focal deficit present.     Mental Status: She is alert and oriented to person, place, and  time.  Psychiatric:        Mood and Affect: Mood normal.        Behavior: Behavior normal.        Thought Content: Thought content normal.        Judgment: Judgment normal.        Assessment & Plan:  Encounter for annual physical exam Assessment & Plan: Age-appropriate screening and counseling performed today. Will check labs and call with results. Preventive measures discussed and printed in AVS for patient. Flu shot today. GYN annually.  Patient Counseling: '[x]'$   Nutrition: Stressed importance of moderation in sodium/caffeine intake, saturated fat and cholesterol, caloric balance, sufficient intake of fresh fruits, vegetables, and fiber.  '[x]'$   Stressed the importance of regular exercise.   '[x]'$   Substance Abuse: Discussed cessation/primary prevention of tobacco, alcohol, or other drug use; driving or other dangerous activities under the influence; availability of treatment for abuse.   '[]'$   Injury prevention: Discussed safety belts, safety helmets, smoke detector, smoking near bedding or upholstery.   '[]'$   Sexuality: Discussed sexually transmitted diseases, partner selection, use of condoms, avoidance of unintended pregnancy  and contraceptive alternatives.   '[x]'$   Dental health: Discussed importance of regular tooth brushing, flossing, and dental visits.  '[x]'$   Health maintenance and immunizations reviewed. Please refer to Health maintenance section.      Orders: -     CBC with Differential/Platelet -     Comprehensive metabolic panel -     Lipid panel -     Hemoglobin A1c  Essential hypertension Assessment & Plan: BP stable and to goal Losartan 25 mg daily Monitor at home Low salt diet / exercise   Orders: -     Comprehensive metabolic panel  Type 2 diabetes mellitus with hyperglycemia, without long-term current use of insulin (HCC) Assessment & Plan: Plan to recheck CMP and A1c today.  Currently diet controlled  Orders: -     Hemoglobin A1c  Mixed  hyperlipidemia Assessment & Plan: Plan to recheck fasting lipid panel today. She continues to take omega-3 fish oil supplements.  Orders: -     Lipid panel  Hx of adenomatous polyp of colon Assessment & Plan: Due for colonoscopy - she will call Dr. Celesta Aver office   Orders: -     Ambulatory referral to  Gastroenterology  Need for immunization against influenza -     Flu Vaccine QUAD 66moIM (Fluarix, Fluzone & Alfiuria Quad PF)        Return in about 1 year (around 09/17/2023) for CPE and fasting labs .      Emmie Frakes M Zyier Dykema, PA-C

## 2022-09-27 ENCOUNTER — Encounter: Payer: Self-pay | Admitting: Physician Assistant

## 2022-09-28 ENCOUNTER — Other Ambulatory Visit: Payer: Self-pay | Admitting: Physician Assistant

## 2022-09-28 DIAGNOSIS — I712 Thoracic aortic aneurysm, without rupture, unspecified: Secondary | ICD-10-CM

## 2022-09-30 ENCOUNTER — Ambulatory Visit (AMBULATORY_SURGERY_CENTER): Payer: Self-pay | Admitting: *Deleted

## 2022-09-30 VITALS — Ht 64.0 in | Wt 197.4 lb

## 2022-09-30 DIAGNOSIS — Z86010 Personal history of colonic polyps: Secondary | ICD-10-CM

## 2022-09-30 NOTE — Progress Notes (Signed)
No egg or soy allergy known to patient  No issues known to pt with past sedation with any surgeries or procedures Patient denies ever being told they had issues or difficulty with intubation  No FH of Malignant Hyperthermia Pt is not on diet pills Pt is not on  home 02  Pt is not on blood thinners  Pt denies issues with constipation  Patient's chart reviewed by Osvaldo Angst CNRA prior to previsit and patient appropriate for the [facility].  Previsit completed and red dot placed by patient's name on their procedure day (on provider's schedule).  Marland Kitchen

## 2022-10-12 ENCOUNTER — Other Ambulatory Visit: Payer: 59

## 2022-10-22 ENCOUNTER — Other Ambulatory Visit: Payer: 59

## 2022-10-27 ENCOUNTER — Ambulatory Visit
Admission: RE | Admit: 2022-10-27 | Discharge: 2022-10-27 | Disposition: A | Discharge status: Home / Self Care | Payer: 59 | Source: Ambulatory Visit | Attending: Physician Assistant | Admitting: Physician Assistant

## 2022-10-27 DIAGNOSIS — I712 Thoracic aortic aneurysm, without rupture, unspecified: Secondary | ICD-10-CM

## 2022-10-27 MED ORDER — IOPAMIDOL (ISOVUE-370) INJECTION 76%
75.0000 mL | Freq: Once | INTRAVENOUS | Status: AC | PRN
  Administered 2022-10-27: 75 mL via INTRAVENOUS

## 2022-10-28 ENCOUNTER — Encounter: Payer: Self-pay | Admitting: Internal Medicine

## 2022-10-30 ENCOUNTER — Encounter: Payer: 59 | Admitting: Internal Medicine

## 2022-11-02 ENCOUNTER — Encounter: Payer: Self-pay | Admitting: Certified Registered Nurse Anesthetist

## 2022-11-03 ENCOUNTER — Encounter: Payer: Self-pay | Admitting: Internal Medicine

## 2022-11-03 ENCOUNTER — Ambulatory Visit (AMBULATORY_SURGERY_CENTER): Payer: 59 | Admitting: Internal Medicine

## 2022-11-03 VITALS — BP 110/64 | HR 51 | Temp 96.0°F | Resp 16 | Ht 64.0 in | Wt 197.4 lb

## 2022-11-03 DIAGNOSIS — D122 Benign neoplasm of ascending colon: Secondary | ICD-10-CM

## 2022-11-03 DIAGNOSIS — D125 Benign neoplasm of sigmoid colon: Secondary | ICD-10-CM

## 2022-11-03 DIAGNOSIS — Z09 Encounter for follow-up examination after completed treatment for conditions other than malignant neoplasm: Secondary | ICD-10-CM

## 2022-11-03 DIAGNOSIS — Z86010 Personal history of colonic polyps: Secondary | ICD-10-CM

## 2022-11-03 DIAGNOSIS — D124 Benign neoplasm of descending colon: Secondary | ICD-10-CM

## 2022-11-03 DIAGNOSIS — D123 Benign neoplasm of transverse colon: Secondary | ICD-10-CM

## 2022-11-03 MED ORDER — SODIUM CHLORIDE 0.9 % IV SOLN: 500.0000 mL | Freq: Once | INTRAVENOUS | Status: DC

## 2022-11-03 NOTE — Progress Notes (Signed)
[facility] History and Physical   Primary Care Physician:  Allwardt, Randa Evens, PA-C   Reason for Procedure:   Hx colon polyps  Plan:    Colonoscopy     HPI: Tanya [name] is a 61 y.o. female here for surveillance colonoscopy  11/2015 - 10 mm adenoma  09/04/2019 4 polyps max 10 mm flat cecal ssp/adenoma 3 - 1 benign mucosal polyp Past Medical History:  Diagnosis Date   Allergy    Asthma    child hood asthma  ONLY    History of chickenpox    Hx of adenomatous polyp of colon 11/27/2015   Hypertension    under control with meds    Plantar fasciitis    Vaginal tear resulting from childbirth    occ will bleed after large bm with this     Past Surgical History:  Procedure Laterality Date   CESAREAN SECTION  1997   COLONOSCOPY     VAGINAL DELIVERY     x2   WISDOM TOOTH EXTRACTION      Prior to Admission medications   Medication Sig Start Date End Date Taking? Authorizing Provider  aspirin EC 81 MG tablet Take 81 mg by mouth daily.   Yes [provider]  Calcium-Magnesium-Vitamin D (CALCIUM MAGNESIUM PO) Take by mouth.   Yes [provider]  cholecalciferol (VITAMIN D) 1000 UNITS tablet Take 1,000 Units by mouth daily.   Yes [provider]  losartan (COZAAR) 25 MG tablet Take 1 tablet (25 mg total) by mouth daily. 12/16/21  Yes Allwardt, Alyssa M, PA-C  Multiple Vitamin (MULTIVITAMIN) tablet Take 1 tablet by mouth daily.   Yes [provider]  Nutritional Supplements (JUICE PLUS FIBRE PO) Take 1 tablet by mouth daily.   Yes [provider]  Omega-3 Fatty Acids (FISH OIL) 1200 MG CAPS Take 1 capsule daily by mouth.   Yes [provider]    Current Outpatient Medications  Medication Sig Dispense Refill   aspirin EC 81 MG tablet Take 81 mg by mouth daily.     Calcium-Magnesium-Vitamin D (CALCIUM MAGNESIUM PO) Take by mouth.     cholecalciferol (VITAMIN D) 1000 UNITS tablet Take 1,000 Units by mouth daily.      losartan (COZAAR) 25 MG tablet Take 1 tablet (25 mg total) by mouth daily. 90 tablet 3   Multiple Vitamin (MULTIVITAMIN) tablet Take 1 tablet by mouth daily.     Nutritional Supplements (JUICE PLUS FIBRE PO) Take 1 tablet by mouth daily.     Omega-3 Fatty Acids (FISH OIL) 1200 MG CAPS Take 1 capsule daily by mouth.     Current Facility-Administered Medications  Medication Dose Route Frequency Provider Last Rate Last Admin   0.9 %  sodium chloride infusion  500 mL Intravenous Once Gatha Mayer, MD        Allergies as of 11/03/2022   (No Known Allergies)    Family History  Problem Relation Age of Onset   Aortic aneurysm Mother    Diabetes Maternal Grandmother    Aortic aneurysm Brother    Colon cancer Neg Hx    Colon polyps Neg Hx    Esophageal cancer Neg Hx    Rectal cancer Neg Hx    Stomach cancer Neg Hx     Social History   Socioeconomic History   Marital status: Married    Spouse name: Not on file   Number of children: Not on file   Years of education: Not on file  Highest education level: Not on file  Occupational History   Not on file  Tobacco Use   Smoking status: Never   Smokeless tobacco: Never  Vaping Use   Vaping Use: Never used  Substance and Sexual Activity   Alcohol use: Yes    Alcohol/week: 2.0 standard drinks of alcohol    Types: 2 Glasses of wine per week    Comment: wine socially   Drug use: No   Sexual activity: Yes    Birth control/protection: Post-menopausal  Other Topics Concern   Not on file  Social History Narrative   Not on file   Social Determinants of Health   Financial Resource Strain: Not on file  Food Insecurity: Not on file  Transportation Needs: Not on file  Physical Activity: Not on file  Stress: Not on file  Social Connections: Not on file  Intimate Partner Violence: Not on file    Review of Systems:  All other review of systems negative except as mentioned in the HPI.  Physical Exam: Vital signs BP 129/81    Pulse (!) 56   Temp (!) 96 F (35.6 C) (Temporal)   Resp 14   Ht '5\' 4"'$  (1.626 m)   Wt 197 lb 6.4 oz (89.5 kg)   LMP 08/25/2015 (Approximate)   SpO2 98%   BMI 33.88 kg/m   General:   Alert,  Well-developed, well-nourished, pleasant and cooperative in NAD Lungs:  Clear throughout to auscultation.   Heart:  Regular rate and rhythm; no murmurs, clicks, rubs,  or gallops. Abdomen:  Soft, nontender and nondistended. Normal bowel sounds.   Neuro/Psych:  Alert and cooperative. Normal mood and affect. A and O x 3   '@Chancie Lampert'$  Simonne Maffucci, MD, [facility] [phone] (pager) 11/03/2022 8:39 AM@

## 2022-11-03 NOTE — Progress Notes (Signed)
Report given to PACU, vss 

## 2022-11-03 NOTE — Progress Notes (Signed)
Called to room to assist during endoscopic procedure.  Patient ID and intended procedure confirmed with present staff. Received instructions for my participation in the procedure from the performing physician.  

## 2022-11-03 NOTE — Progress Notes (Signed)
VS completed by DT.  Pt's states no medical or surgical changes since previsit or office visit.  

## 2022-11-03 NOTE — Op Note (Signed)
[facility] Patient Name: Tanya [name] Procedure Date: 11/03/2022 8:37 AM MRN: [id number] Endoscopist: Gatha Mayer , MD, [id number] Age: 61 Referring MD:  Date of Birth: 04-Dec-1960 Gender: Female Account #: [id number] Procedure:                Colonoscopy Indications:              Surveillance: Personal history of adenomatous                            polyps on last colonoscopy 3 years ago, Last                            colonoscopy: 2020 Medicines:                Monitored Anesthesia Care Procedure:                Pre-Anesthesia Assessment:                           - Prior to the procedure, a History and Physical                            was performed, and patient medications and                            allergies were reviewed. The patient's tolerance of                            previous anesthesia was also reviewed. The risks                            and benefits of the procedure and the sedation                            options and risks were discussed with the patient.                            All questions were answered, and informed consent                            was obtained. Prior Anticoagulants: The patient has                            taken no anticoagulant or antiplatelet agents. ASA                            Grade Assessment: II - A patient with mild systemic                            disease. After reviewing the risks and benefits,                            the patient was deemed in satisfactory condition to  undergo the procedure.                           After obtaining informed consent, the colonoscope                            was passed under direct vision. Throughout the                            procedure, the patient's blood pressure, pulse, and                            oxygen saturations were monitored continuously. The                            Olympus CF-HQ190L [id number]) Colonoscope was                             introduced through the anus and advanced to the the                            cecum, identified by appendiceal orifice and                            ileocecal valve. The colonoscopy was performed                            without difficulty. The patient tolerated the                            procedure well. The quality of the bowel                            preparation was excellent. The ileocecal valve,                            appendiceal orifice, and rectum were photographed.                            The bowel preparation used was Miralax via split                            dose instruction. Scope In: 8:45:44 AM Scope Out: 9:10:59 AM Scope Withdrawal Time: 0 hours 20 minutes 51 seconds  Total Procedure Duration: 0 hours 25 minutes 15 seconds  Findings:                 The perianal and digital rectal examinations were                            normal.                           Five sessile polyps were found in the sigmoid  colon, descending colon, transverse colon and                            ascending colon. The polyps were diminutive in                            size. These polyps were removed with a cold snare.                            Resection was complete, but the polyp tissue was                            only partially retrieved. Verification of patient                            identification for the specimen was done. Estimated                            blood loss was minimal.                           The exam was otherwise without abnormality on                            direct and retroflexion views. Complications:            No immediate complications. Estimated Blood Loss:     Estimated blood loss was minimal. Impression:               - Five diminutive polyps in the sigmoid colon, in                            the descending colon, in the transverse colon and                            in the  ascending colon, removed with a cold snare.                            Complete resection. Partial retrieval. One                            tranverse polyp not recovered                           - The examination was otherwise normal on direct                            and retroflexion views.                           - Personal history of colonic polyps. 11/2015 - 10                            mm adenoma  09/04/2019 4 polyps max 10 mm flat cecal                            ssp/adenoma 3 - 1 benign mucosal polyp Recommendation:           - Patient has a contact number available for                            emergencies. The signs and symptoms of potential                            delayed complications were discussed with the                            patient. Return to normal activities tomorrow.                            Written discharge instructions were provided to the                            patient.                           - Resume previous diet.                           - Continue present medications.                           - Repeat colonoscopy is recommended for                            surveillance. The colonoscopy date will be                            determined after pathology results from today's                            exam become available for review. Gatha Mayer, MD 11/03/2022 9:20:59 AM This report has been signed electronically.

## 2022-11-03 NOTE — Patient Instructions (Addendum)
I found and removed 5 tiny polyps.  I will let you know pathology results and when to have another routine colonoscopy by mail and/or My Chart.  I appreciate the opportunity to care for you. Gatha Mayer, MD, [facility]  Handout given for polyps   YOU HAD AN ENDOSCOPIC PROCEDURE TODAY AT [facility]:   Refer to the procedure report that was given to you for any specific questions about what was found during the examination.  If the procedure report does not answer your questions, please call your gastroenterologist to clarify.  If you requested that your care partner not be given the details of your procedure findings, then the procedure report has been included in a sealed envelope for you to review at your convenience later.  YOU SHOULD EXPECT: Some feelings of bloating in the abdomen. Passage of more gas than usual.  Walking can help get rid of the air that was put into your GI tract during the procedure and reduce the bloating. If you had a lower endoscopy (such as a colonoscopy or flexible sigmoidoscopy) you may notice spotting of blood in your stool or on the toilet paper. If you underwent a bowel prep for your procedure, you may not have a normal bowel movement for a few days.  Please Note:  You might notice some irritation and congestion in your nose or some drainage.  This is from the oxygen used during your procedure.  There is no need for concern and it should clear up in a day or so.  SYMPTOMS TO REPORT IMMEDIATELY:  Following lower endoscopy (colonoscopy):  Excessive amounts of blood in the stool  Significant tenderness or worsening of abdominal pains  Swelling of the abdomen that is new, acute  Fever of 100F or higher  For urgent or emergent issues, a gastroenterologist can be reached at any hour by calling [phone]. Do not use MyChart messaging for urgent concerns.    DIET:  We do recommend a small meal at first, but then you may proceed to your regular  diet.  Drink plenty of fluids but you should avoid alcoholic beverages for 24 hours.  ACTIVITY:  You should plan to take it easy for the rest of today and you should NOT DRIVE or use heavy machinery until tomorrow (because of the sedation medicines used during the test).    FOLLOW UP: Our staff will call the number listed on your records the next business day following your procedure.  We will call around 7:15- 8:00 am to check on you and address any questions or concerns that you may have regarding the information given to you following your procedure. If we do not reach you, we will leave a message.     If any biopsies were taken you will be contacted by phone or by letter within the next 1-3 weeks.  Please call us at [phone] if you have not heard about the biopsies in 3 weeks.    SIGNATURES/CONFIDENTIALITY: You and/or your care partner have signed paperwork which will be entered into your electronic medical record.  These signatures attest to the fact that that the information above on your After Visit Summary has been reviewed and is understood.  Full responsibility of the confidentiality of this discharge information lies with you and/or your care-partner.

## 2022-11-10 ENCOUNTER — Encounter: Payer: Self-pay | Admitting: Internal Medicine

## 2022-12-04 ENCOUNTER — Other Ambulatory Visit: Payer: Self-pay | Admitting: Physician Assistant

## 2022-12-04 DIAGNOSIS — I10 Essential (primary) hypertension: Secondary | ICD-10-CM

## 2023-09-20 ENCOUNTER — Encounter: Payer: 59 | Admitting: Physician Assistant

## 2023-09-30 LAB — HM MAMMOGRAPHY

## 2023-10-18 ENCOUNTER — Encounter: Payer: Self-pay | Admitting: Physician Assistant

## 2023-10-18 NOTE — Progress Notes (Unsigned)
  Cardiology Office Note:  .   Date:  10/18/2023  ID:  Tanya [name], DOB 1961-11-10, MRN [id number] PCP: Allwardt, Crist Infante, PA-C  [facility] Providers Cardiologist:  None { Click to update primary MD,subspecialty MD or APP then REFRESH:1}   History of Present Illness: Tanya [name] is a 62 y.o. female with hx of HTN, here due to a family member with an ascending aneurysm. Had a CTA with an aneurysm of 4.1 cm  Marfan? Ehlers danlos  ROS: ***  Studies Reviewed: .        *** Risk Assessment/Calculations:        Physical Exam:   VS:  LMP 08/25/2015 (Approximate)    Wt Readings from Last 3 Encounters:  11/03/22 197 lb 6.4 oz (89.5 kg)  09/30/22 197 lb 6.4 oz (89.5 kg)  09/16/22 193 lb 6.4 oz (87.7 kg)    GEN: Well nourished, well developed in no acute distress NECK: No JVD; No carotid bruits CARDIAC: ***RRR, no murmurs, rubs, gallops RESPIRATORY:  Clear to auscultation without rales, wheezing or rhonchi  ABDOMEN: Soft, non-tender, non-distended EXTREMITIES:  No edema; No deformity   ASSESSMENT AND PLAN: .   Thoracic aortic aneurysm Size 4.1 cm; will repeat study now    {Are you ordering a CV Procedure (e.g. stress test, cath, DCCV, TEE, etc)?   Press F2        :[id number]}  Dispo: ***  Signed, Maisie Fus, MD

## 2023-10-19 ENCOUNTER — Other Ambulatory Visit: Payer: Self-pay

## 2023-10-19 ENCOUNTER — Ambulatory Visit: Payer: 59 | Attending: Internal Medicine | Admitting: Internal Medicine

## 2023-10-19 ENCOUNTER — Encounter: Payer: Self-pay | Admitting: Internal Medicine

## 2023-10-19 VITALS — BP 120/80 | HR 63 | Ht 64.0 in | Wt 191.2 lb

## 2023-10-19 DIAGNOSIS — Q2381 Bicuspid aortic valve: Secondary | ICD-10-CM

## 2023-10-19 DIAGNOSIS — Z0000 Encounter for general adult medical examination without abnormal findings: Secondary | ICD-10-CM

## 2023-10-19 DIAGNOSIS — I10 Essential (primary) hypertension: Secondary | ICD-10-CM

## 2023-10-19 NOTE — Patient Instructions (Addendum)
Medication Instructions:  No changes  *If you need a refill on your cardiac medications before your next appointment, please call your pharmacy*    Testing/Procedures: CT Anigio Chest Arota    Follow-Up: At [facility], you and your health needs are our priority.  As part of our continuing mission to provide you with exceptional heart care, we have created designated Provider Care Teams.  These Care Teams include your primary Cardiologist (physician) and Advanced Practice Providers (APPs -  Physician Assistants and Nurse Practitioners) who all work together to provide you with the care you need, when you need it.  We recommend signing up for the patient portal called "MyChart".  Sign up information is provided on this After Visit Summary.  MyChart is used to connect with patients for Virtual Visits (Telemedicine).  Patients are able to view lab/test results, encounter notes, upcoming appointments, etc.  Non-urgent messages can be sent to your provider as well.   To learn more about what you can do with MyChart, go to [email or web address].    Your next appointment:   12 month(s)  Provider:   Dr.Mary Wyline Mood

## 2023-10-20 ENCOUNTER — Other Ambulatory Visit (INDEPENDENT_AMBULATORY_CARE_PROVIDER_SITE_OTHER): Payer: 59

## 2023-10-20 DIAGNOSIS — Z0000 Encounter for general adult medical examination without abnormal findings: Secondary | ICD-10-CM

## 2023-10-20 DIAGNOSIS — Z131 Encounter for screening for diabetes mellitus: Secondary | ICD-10-CM | POA: Diagnosis not present

## 2023-10-20 LAB — COMPREHENSIVE METABOLIC PANEL
ALT: 44 U/L — ABNORMAL HIGH (ref 0–35)
AST: 31 U/L (ref 0–37)
Albumin: 4.3 g/dL (ref 3.5–5.2)
Alkaline Phosphatase: 65 U/L (ref 39–117)
BUN: 19 mg/dL (ref 6–23)
CO2: 27 meq/L (ref 19–32)
Calcium: 9.4 mg/dL (ref 8.4–10.5)
Chloride: 104 meq/L (ref 96–112)
Creatinine, Ser: 0.79 mg/dL (ref 0.40–1.20)
GFR: 80.5 mL/min (ref >60.00)
Glucose, Bld: 134 mg/dL — ABNORMAL HIGH (ref 70–99)
Potassium: 4 meq/L (ref 3.5–5.1)
Sodium: 138 meq/L (ref 135–145)
Total Bilirubin: 0.6 mg/dL (ref 0.2–1.2)
Total Protein: 7.2 g/dL (ref 6.0–8.3)

## 2023-10-20 LAB — HEMOGLOBIN A1C: Hgb A1c MFr Bld: 6.3 % (ref 4.6–6.5)

## 2023-10-20 LAB — CBC WITH DIFFERENTIAL/PLATELET
Basophils Absolute: 0 10*3/uL (ref 0.0–0.1)
Basophils Relative: 0.4 % (ref 0.0–3.0)
Eosinophils Absolute: 0 10*3/uL (ref 0.0–0.7)
Eosinophils Relative: 0.7 % (ref 0.0–5.0)
HCT: 43.4 % (ref 36.0–46.0)
Hemoglobin: 14.3 g/dL (ref 12.0–15.0)
Lymphocytes Relative: 35.1 % (ref 12.0–46.0)
Lymphs Abs: 1.7 10*3/uL (ref 0.7–4.0)
MCHC: 32.9 g/dL (ref 30.0–36.0)
MCV: 95.1 fL (ref 78.0–100.0)
Monocytes Absolute: 0.5 10*3/uL (ref 0.1–1.0)
Monocytes Relative: 10.9 % (ref 3.0–12.0)
Neutro Abs: 2.5 10*3/uL (ref 1.4–7.7)
Neutrophils Relative %: 52.9 % (ref 43.0–77.0)
Platelets: 222 10*3/uL (ref 150.0–400.0)
RBC: 4.56 Mil/uL (ref 3.87–5.11)
RDW: 13.3 % (ref 11.5–15.5)
WBC: 4.8 10*3/uL (ref 4.0–10.5)

## 2023-10-20 LAB — LIPID PANEL
Cholesterol: 221 mg/dL — ABNORMAL HIGH (ref 0–200)
HDL: 71.3 mg/dL (ref >39.00)
LDL Cholesterol: 127 mg/dL — ABNORMAL HIGH (ref 0–99)
NonHDL: 150.04
Total CHOL/HDL Ratio: 3
Triglycerides: 113 mg/dL (ref 0.0–149.0)
VLDL: 22.6 mg/dL (ref 0.0–40.0)

## 2023-10-25 ENCOUNTER — Encounter: Payer: Self-pay | Admitting: Physician Assistant

## 2023-10-25 ENCOUNTER — Ambulatory Visit (INDEPENDENT_AMBULATORY_CARE_PROVIDER_SITE_OTHER): Payer: 59 | Admitting: Physician Assistant

## 2023-10-25 VITALS — BP 128/84 | HR 65 | Temp 97.5°F | Resp 16 | Ht 64.0 in | Wt 193.0 lb

## 2023-10-25 DIAGNOSIS — I10 Essential (primary) hypertension: Secondary | ICD-10-CM | POA: Diagnosis not present

## 2023-10-25 DIAGNOSIS — Z0000 Encounter for general adult medical examination without abnormal findings: Secondary | ICD-10-CM

## 2023-10-25 DIAGNOSIS — I712 Thoracic aortic aneurysm, without rupture, unspecified: Secondary | ICD-10-CM | POA: Diagnosis not present

## 2023-10-25 DIAGNOSIS — E782 Mixed hyperlipidemia: Secondary | ICD-10-CM | POA: Diagnosis not present

## 2023-10-25 DIAGNOSIS — Z1283 Encounter for screening for malignant neoplasm of skin: Secondary | ICD-10-CM

## 2023-10-25 DIAGNOSIS — Z789 Other specified health status: Secondary | ICD-10-CM

## 2023-10-25 DIAGNOSIS — E1165 Type 2 diabetes mellitus with hyperglycemia: Secondary | ICD-10-CM | POA: Diagnosis not present

## 2023-10-25 LAB — MICROALBUMIN / CREATININE URINE RATIO
Creatinine,U: 155 mg/dL
Microalb Creat Ratio: 1.9 mg/g (ref 0.0–30.0)
Microalb, Ur: 3 mg/dL — ABNORMAL HIGH (ref 0.0–1.9)

## 2023-10-25 NOTE — Progress Notes (Signed)
Patient ID: Tanya [name], female    DOB: 27-Jul-1961, 62 y.o.   MRN: [id number]   Assessment & Plan:  Encounter for annual physical exam  Thoracic aortic aneurysm without rupture, unspecified part (HCC)  Essential hypertension  Mixed hyperlipidemia Assessment & Plan: Statins made her joints hurt. She continues to take omega-3 fish oil supplements. She is careful with nutrition. Exercises regularly.    Type 2 diabetes mellitus with hyperglycemia, without long-term current use of insulin (HCC) -     Microalbumin / creatinine urine ratio  Skin cancer screening  Statin intolerance   Assessment and Plan    Pre-diabetes Stable A1C levels over the past six years, ranging from 6.1 to 6.5. Patient maintains an active lifestyle and has a stable weight. Discussed the potential need for medication such as metformin or glipizide in the future if A1C levels increase. -Continue current lifestyle modifications and monitor A1C levels annually.  Hyperlipidemia Patient has a history of intolerance to statins. Discussed the potential use of non-statin medications such as Repatha in the future if cholesterol levels worsen. -Continue current lifestyle modifications and monitor lipid panel annually.  General Health Maintenance -Annual mammogram completed at [facility] -Reviewed labs with patient -Annual eye exam with Dr. Emily Filbert. -Colonoscopy due in 2026. -Check urine microalbumin today to monitor for early signs of kidney damage. -Follow up with cardiologist for repeat CT scan.          Return in about 1 year (around 10/24/2024) for physical.    Subjective:    Chief Complaint  Patient presents with   Annual Exam    HPI Patient is in today for annual exam.  She is following with cardiology now.   Health maintenance: Lifestyle/ exercise: Enjoys tennis & pickleball 2-3x weekly Nutrition: Tries to keep diet well-balanced Mental health: Normal life stressors  Sleep: Occasional  nighttime awakenings Substance use: None ETOH: 2-3x per week wine glass  Immunizations: Flu shot today  Colonoscopy: Polyp hx, next due in 2026  Pap: GYN annually  Mammogram: goes through [place], UTD   Past Medical History:  Diagnosis Date   Allergy    Asthma    child hood asthma  ONLY    History of chickenpox    Hx of adenomatous polyp of colon 11/27/2015   Hypertension    under control with meds    Plantar fasciitis    Vaginal tear resulting from childbirth    occ will bleed after large bm with this     Past Surgical History:  Procedure Laterality Date   CESAREAN SECTION  1997   COLONOSCOPY     VAGINAL DELIVERY     x2   WISDOM TOOTH EXTRACTION      Family History  Problem Relation Age of Onset   Aortic aneurysm Mother    Diabetes Maternal Grandmother    Aortic aneurysm Brother    Colon cancer Neg Hx    Colon polyps Neg Hx    Esophageal cancer Neg Hx    Rectal cancer Neg Hx    Stomach cancer Neg Hx     Social History   Tobacco Use   Smoking status: Never   Smokeless tobacco: Never  Vaping Use   Vaping status: Never Used  Substance Use Topics   Alcohol use: Yes    Alcohol/week: 2.0 standard drinks of alcohol    Types: 2 Glasses of wine per week    Comment: wine socially   Drug use: No     No Known  Allergies  Review of Systems NEGATIVE UNLESS OTHERWISE INDICATED IN HPI      Objective:     BP 128/84 (BP Location: Right Arm, Patient Position: Supine, Cuff Size: Normal)   Pulse 65   Temp (!) 97.5 F (36.4 C) (Temporal)   Resp 16   Ht 5\' 4"  (1.626 m)   Wt 193 lb (87.5 kg)   LMP 08/25/2015 (Approximate)   SpO2 95%   BMI 33.13 kg/m   Wt Readings from Last 3 Encounters:  10/25/23 193 lb (87.5 kg)  10/19/23 191 lb 3.2 oz (86.7 kg)  11/03/22 197 lb 6.4 oz (89.5 kg)    BP Readings from Last 3 Encounters:  10/25/23 128/84  10/19/23 120/80  11/03/22 110/64     Physical Exam Vitals and nursing note reviewed.  Constitutional:       Appearance: Normal appearance. She is normal weight. She is not toxic-appearing.  HENT:     Head: Normocephalic and atraumatic.     Right Ear: Tympanic membrane, ear canal and external ear normal.     Left Ear: Tympanic membrane, ear canal and external ear normal.     Nose: Nose normal.     Mouth/Throat:     Mouth: Mucous membranes are moist.  Eyes:     Extraocular Movements: Extraocular movements intact.     Conjunctiva/sclera: Conjunctivae normal.     Pupils: Pupils are equal, round, and reactive to light.  Cardiovascular:     Rate and Rhythm: Normal rate and regular rhythm.     Pulses: Normal pulses.     Heart sounds: Normal heart sounds.  Pulmonary:     Effort: Pulmonary effort is normal.     Breath sounds: Normal breath sounds.  Abdominal:     General: Abdomen is flat. Bowel sounds are normal.     Palpations: Abdomen is soft.  Musculoskeletal:        General: Normal range of motion.     Cervical back: Normal range of motion and neck supple.     Right lower leg: No edema.     Left lower leg: No edema.  Skin:    General: Skin is warm and dry.     Comments: Scattered cherry angiomas  Neurological:     General: No focal deficit present.     Mental Status: She is alert and oriented to person, place, and time.  Psychiatric:        Mood and Affect: Mood normal.        Behavior: Behavior normal.        Thought Content: Thought content normal.        Judgment: Judgment normal.           Bellami Farrelly M Chibuikem Thang, PA-C

## 2023-10-25 NOTE — Assessment & Plan Note (Signed)
Statins made her joints hurt. She continues to take omega-3 fish oil supplements. She is careful with nutrition. Exercises regularly.

## 2023-11-23 ENCOUNTER — Encounter: Payer: Self-pay | Admitting: Internal Medicine

## 2023-11-25 ENCOUNTER — Encounter: Payer: Self-pay | Admitting: Physician Assistant

## 2023-11-26 ENCOUNTER — Other Ambulatory Visit: Payer: Self-pay

## 2023-11-26 DIAGNOSIS — I10 Essential (primary) hypertension: Secondary | ICD-10-CM

## 2023-11-26 MED ORDER — LOSARTAN POTASSIUM 25 MG PO TABS: 25.0000 mg | ORAL_TABLET | Freq: Every day | ORAL | 3 refills | Status: DC

## 2023-12-01 ENCOUNTER — Encounter: Payer: Self-pay | Admitting: Physician Assistant

## 2023-12-02 NOTE — Telephone Encounter (Signed)
Please see patient message and advise.

## 2023-12-20 ENCOUNTER — Ambulatory Visit (HOSPITAL_COMMUNITY)
Admission: RE | Admit: 2023-12-20 | Discharge: 2023-12-20 | Disposition: A | Discharge status: Home / Self Care | Payer: 59 | Source: Ambulatory Visit | Attending: Internal Medicine | Admitting: Internal Medicine

## 2023-12-20 DIAGNOSIS — Q2381 Bicuspid aortic valve: Secondary | ICD-10-CM | POA: Diagnosis present

## 2023-12-20 DIAGNOSIS — I10 Essential (primary) hypertension: Secondary | ICD-10-CM | POA: Diagnosis present

## 2023-12-20 MED ORDER — IOHEXOL 350 MG/ML SOLN
75.0000 mL | Freq: Once | INTRAVENOUS | Status: AC | PRN
  Administered 2023-12-20: 75 mL via INTRAVENOUS

## 2023-12-28 ENCOUNTER — Encounter: Payer: Self-pay | Admitting: Internal Medicine

## 2024-04-22 ENCOUNTER — Encounter: Payer: Self-pay | Admitting: Physician Assistant

## 2024-04-24 ENCOUNTER — Other Ambulatory Visit: Payer: Self-pay

## 2024-04-24 DIAGNOSIS — E782 Mixed hyperlipidemia: Secondary | ICD-10-CM

## 2024-04-24 DIAGNOSIS — E559 Vitamin D deficiency, unspecified: Secondary | ICD-10-CM

## 2024-04-24 NOTE — Telephone Encounter (Signed)
 Please see pt msg and advise; not showing a Statin medication on file or in history of medications

## 2024-04-26 ENCOUNTER — Other Ambulatory Visit (INDEPENDENT_AMBULATORY_CARE_PROVIDER_SITE_OTHER)

## 2024-04-26 DIAGNOSIS — E782 Mixed hyperlipidemia: Secondary | ICD-10-CM | POA: Diagnosis not present

## 2024-04-26 DIAGNOSIS — E559 Vitamin D deficiency, unspecified: Secondary | ICD-10-CM

## 2024-04-26 LAB — LIPID PANEL
Cholesterol: 210 mg/dL — ABNORMAL HIGH (ref 0–200)
HDL: 68.9 mg/dL (ref >39.00)
LDL Cholesterol: 116 mg/dL — ABNORMAL HIGH (ref 0–99)
NonHDL: 141.4
Total CHOL/HDL Ratio: 3
Triglycerides: 127 mg/dL (ref 0.0–149.0)
VLDL: 25.4 mg/dL (ref 0.0–40.0)

## 2024-04-27 LAB — VITAMIN D 25 HYDROXY (VIT D DEFICIENCY, FRACTURES): VITD: 44.7 ng/mL (ref 30.00–100.00)

## 2024-04-30 ENCOUNTER — Ambulatory Visit: Payer: Self-pay | Admitting: Physician Assistant

## 2024-05-01 NOTE — Telephone Encounter (Signed)
 Left VM for patient to return call for lab results and set up virtual visit.

## 2024-05-02 ENCOUNTER — Encounter: Payer: Self-pay | Admitting: Physician Assistant

## 2024-05-02 ENCOUNTER — Telehealth (INDEPENDENT_AMBULATORY_CARE_PROVIDER_SITE_OTHER): Admitting: Physician Assistant

## 2024-05-02 VITALS — Ht 64.0 in | Wt 193.0 lb

## 2024-05-02 DIAGNOSIS — E782 Mixed hyperlipidemia: Secondary | ICD-10-CM | POA: Diagnosis not present

## 2024-05-02 DIAGNOSIS — R7303 Prediabetes: Secondary | ICD-10-CM | POA: Diagnosis not present

## 2024-05-02 DIAGNOSIS — I712 Thoracic aortic aneurysm, without rupture, unspecified: Secondary | ICD-10-CM | POA: Diagnosis not present

## 2024-05-02 NOTE — Progress Notes (Signed)
 Virtual Visit via Video Note  I connected with  Tanya [name]  on 05/02/24 at 11:00 AM EDT by a video enabled telemedicine application and verified that I am speaking with the correct person using two identifiers.  Location: Patient: home Provider: Nature conservation officer at [employer] Persons present: Patient and myself   I discussed the limitations of evaluation and management by telemedicine and the availability of in person appointments. The patient expressed understanding and agreed to proceed.   History of Present Illness:  Discussed the use of AI scribe software for clinical note transcription with the patient, who gave verbal consent to proceed.  History of Present Illness Tanya [name] is a 63 year old female with hyperlipidemia and prediabetes who presents for evaluation of cholesterol management and cardiovascular risk.  She is concerned about her cholesterol levels, describing them as 'still out of compliance.' She was on cholesterol medication approximately ten years ago but discontinued it due to joint pain during exercise. Her LDL cholesterol has decreased slightly from 127 to 116, and her total cholesterol has decreased from 221 to 210. She maintains a high HDL level due to regular exercise. Despite these improvements, she is concerned about her cardiovascular risk, which is calculated at 7% over the next ten years.  She has a family history of adverse reactions to cholesterol medications, as her mother experienced significant side effects. She is keen on maintaining her active lifestyle, which includes outdoor activities like tennis, and is hesitant to restart medication that might impede this.  She has a history of prediabetes, with her A1c levels recorded as 6.2 and 6.3 in recent years. Three years ago, her A1c was 6.5, which technically classified her as diabetic at that time. She reports difficulty in losing weight, maintaining a steady weight despite  efforts, and describes herself as living on the cusp between overweight and obesity.  She has a stable ascending thoracic aorta, measuring 3.9 cm, which has been stable since 2023. She reports a family history of aortic aneurysm in her mother and brother.  She recently traveled to [place] and is planning a trip to [place], including [place] and [place]. She has received multiple vaccinations in preparation for this trip, including typhoid, hepatitis A and C, meningitis, and malaria prophylaxis. No adverse reactions to these vaccinations.     Observations/Objective:   Gen: Awake, alert, no acute distress Resp: Breathing is even and non-labored Psych: calm/pleasant demeanor Neuro: Alert and Oriented x 3, + facial symmetry, speech is clear.   Assessment and Plan:  Assessment and Plan Assessment & Plan Hyperlipidemia LDL cholesterol decreased from 127 to 116, and total cholesterol decreased from 221 to 210. HDL levels remain optimal due to regular exercise. She has a 7% risk score for cardiovascular events in the next ten years, considering age, sex, diabetes, and hypertension. Previously experienced joint pain with statins, impacting quality of life. Discussed potential benefits of a cardiac calcium score to assess plaque buildup and guide treatment decisions. The test is self-pay at $99 and provides detailed information on plaque buildup, aiding in decision-making. If the score is high, aggressive treatment may be necessary. - Order cardiac calcium score test - Monitor cholesterol levels and reassess treatment options based on cardiac calcium score results  Prediabetes A1c levels have been 6.2 to 6.3 over the past two years, indicating prediabetes. A previous A1c of 6.5 three years ago classified her as diabetic in the system, but recent levels suggest prediabetes. Advised to lose weight but has  maintained a steady weight. Emphasized the importance of maintaining an active lifestyle. - Monitor  A1c levels after cardiac calcium score results - Encourage continued physical activity  Ascending thoracic aorta dilation Ascending thoracic aorta measures 3.9 cm and has been stable since 2023. Previously assessed as mildly dilated with no immediate intervention required. Family history of aortic aneurysm. Prefers annual monitoring for reassurance. - Plan for annual monitoring of the ascending thoracic aorta in February 2026  General Health Maintenance Received vaccinations for travel to [place], including typhoid, hepatitis A, hepatitis C, and meningitis. Prescribed malaria prophylaxis. - Ensure completion of malaria prophylaxis as prescribed    Follow Up Instructions:    I discussed the assessment and treatment plan with the patient. The patient was provided an opportunity to ask questions and all were answered. The patient agreed with the plan and demonstrated an understanding of the instructions.   The patient was advised to call back or seek an in-person evaluation if the symptoms worsen or if the condition fails to improve as anticipated.  Raymir Frommelt M [place] Inthavong, PA-C

## 2024-05-31 ENCOUNTER — Ambulatory Visit
Admission: RE | Admit: 2024-05-31 | Discharge: 2024-05-31 | Disposition: A | Discharge status: Home / Self Care | Source: Ambulatory Visit | Attending: Physician Assistant | Admitting: Physician Assistant

## 2024-05-31 DIAGNOSIS — E782 Mixed hyperlipidemia: Secondary | ICD-10-CM

## 2024-06-08 ENCOUNTER — Ambulatory Visit: Payer: Self-pay | Admitting: Physician Assistant

## 2024-07-27 ENCOUNTER — Encounter: Payer: Self-pay | Admitting: Physician Assistant

## 2024-07-28 NOTE — Telephone Encounter (Signed)
Would you like patient to schedule an appointment?

## 2024-07-31 NOTE — Telephone Encounter (Signed)
 Please see pt msg and advise

## 2024-08-01 NOTE — Telephone Encounter (Signed)
 Please see provider msg and schedule patient

## 2024-08-02 ENCOUNTER — Ambulatory Visit: Admitting: Physician Assistant

## 2024-08-02 ENCOUNTER — Encounter: Payer: Self-pay | Admitting: Physician Assistant

## 2024-08-02 ENCOUNTER — Ambulatory Visit (INDEPENDENT_AMBULATORY_CARE_PROVIDER_SITE_OTHER)

## 2024-08-02 ENCOUNTER — Ambulatory Visit: Payer: Self-pay | Admitting: Physician Assistant

## 2024-08-02 VITALS — BP 120/80 | HR 75 | Temp 97.2°F | Ht 64.0 in | Wt 188.6 lb

## 2024-08-02 DIAGNOSIS — R0789 Other chest pain: Secondary | ICD-10-CM

## 2024-08-02 DIAGNOSIS — R61 Generalized hyperhidrosis: Secondary | ICD-10-CM | POA: Diagnosis not present

## 2024-08-02 DIAGNOSIS — Z23 Encounter for immunization: Secondary | ICD-10-CM | POA: Diagnosis not present

## 2024-08-02 DIAGNOSIS — E1165 Type 2 diabetes mellitus with hyperglycemia: Secondary | ICD-10-CM

## 2024-08-02 DIAGNOSIS — Z20822 Contact with and (suspected) exposure to covid-19: Secondary | ICD-10-CM

## 2024-08-02 LAB — CBC WITH DIFFERENTIAL/PLATELET
Basophils Absolute: 0 K/uL (ref 0.0–0.1)
Basophils Relative: 0.7 % (ref 0.0–3.0)
Eosinophils Absolute: 0.1 K/uL (ref 0.0–0.7)
Eosinophils Relative: 1.1 % (ref 0.0–5.0)
HCT: 39.5 % (ref 36.0–46.0)
Hemoglobin: 13 g/dL (ref 12.0–15.0)
Lymphocytes Relative: 29.3 % (ref 12.0–46.0)
Lymphs Abs: 1.4 K/uL (ref 0.7–4.0)
MCHC: 32.8 g/dL (ref 30.0–36.0)
MCV: 93.7 fl (ref 78.0–100.0)
Monocytes Absolute: 0.5 K/uL (ref 0.1–1.0)
Monocytes Relative: 9.6 % (ref 3.0–12.0)
Neutro Abs: 2.8 K/uL (ref 1.4–7.7)
Neutrophils Relative %: 59.3 % (ref 43.0–77.0)
Platelets: 386 K/uL (ref 150.0–400.0)
RBC: 4.22 Mil/uL (ref 3.87–5.11)
RDW: 13.8 % (ref 11.5–15.5)
WBC: 4.7 K/uL (ref 4.0–10.5)

## 2024-08-02 LAB — MICROALBUMIN / CREATININE URINE RATIO
Creatinine,U: 125 mg/dL
Microalb Creat Ratio: 23.5 mg/g (ref 0.0–30.0)
Microalb, Ur: 2.9 mg/dL — ABNORMAL HIGH (ref 0.0–1.9)

## 2024-08-02 LAB — COMPREHENSIVE METABOLIC PANEL WITH GFR
ALT: 102 U/L — ABNORMAL HIGH (ref 0–35)
AST: 51 U/L — ABNORMAL HIGH (ref 0–37)
Albumin: 3.9 g/dL (ref 3.5–5.2)
Alkaline Phosphatase: 137 U/L — ABNORMAL HIGH (ref 39–117)
BUN: 17 mg/dL (ref 6–23)
CO2: 26 meq/L (ref 19–32)
Calcium: 10.2 mg/dL (ref 8.4–10.5)
Chloride: 100 meq/L (ref 96–112)
Creatinine, Ser: 0.77 mg/dL (ref 0.40–1.20)
GFR: 82.56 mL/min (ref >60.00)
Glucose, Bld: 126 mg/dL — ABNORMAL HIGH (ref 70–99)
Potassium: 4.2 meq/L (ref 3.5–5.1)
Sodium: 138 meq/L (ref 135–145)
Total Bilirubin: 0.5 mg/dL (ref 0.2–1.2)
Total Protein: 7.8 g/dL (ref 6.0–8.3)

## 2024-08-02 LAB — HEMOGLOBIN A1C: Hgb A1c MFr Bld: 7.1 % — ABNORMAL HIGH (ref 4.6–6.5)

## 2024-08-02 LAB — TSH: TSH: 2.06 u[IU]/mL (ref 0.35–5.50)

## 2024-08-02 MED ORDER — AMOXICILLIN-POT CLAVULANATE 875-125 MG PO TABS
1.0000 | ORAL_TABLET | Freq: Two times a day (BID) | ORAL | 0 refills | Status: AC
Start: 2024-08-02 — End: 2024-08-09

## 2024-08-02 MED ORDER — AZITHROMYCIN 250 MG PO TABS: ORAL_TABLET | ORAL | 0 refills | Status: DC

## 2024-08-02 NOTE — Progress Notes (Signed)
 Patient ID: Tanya [name], female    DOB: 1961-05-18, 63 y.o.   MRN: [id number]   Assessment & Plan:  Chest pressure -     EKG 12-Lead -     DG Chest 2 View; Future  Contact with and (suspected) exposure to covid-19 -     DG Chest 2 View; Future  Diaphoresis -     CBC with Differential/Platelet -     Comprehensive metabolic panel with GFR -     TSH -     DG Chest 2 View; Future  Type 2 diabetes mellitus with hyperglycemia, without long-term current use of insulin (HCC) -     Microalbumin / creatinine urine ratio -     Hemoglobin A1c -     Comprehensive metabolic panel with GFR  Need for pneumococcal vaccine -     Pneumococcal conjugate vaccine 20-valent  Immunization due -     Flu vaccine trivalent PF, 6mos and older(Flulaval,Afluria,Fluarix,Fluzone)    Assessment & Plan Post-viral syndrome (suspected COVID-19) Symptoms consistent with post-viral syndrome following a potential COVID-19 infection, including fatigue, night sweats, and cough. Fever has resolved. Differential diagnosis includes COVID-19 pneumonia, given chest pressure and cough. - Perform EKG to compare with previous baseline from 2024 - Order chest x-ray to rule out COVID-19 pneumonia - Order blood work to check for any changes or underlying issues - Advise monitoring for any worsening symptoms such as chest pain radiating to neck, jaw, or arm, which would necessitate ER visit  Bradycardia EKG shows slight bradycardia with heart rate at 58 bpm, normal rhythm, and no concerning T wave changes. Bradycardia is consistent with previous EKG from 2024 and is asymptomatic.  Type 2 diabetes mellitus with hyperglycemia Routine monitoring and management of diabetes are implied. - Check A1c as part of routine blood work - Normal A1c's last few years, lifestyle controlled, continue to monitor Lab Results  Component Value Date   HGBA1C 6.3 10/20/2023   HGBA1C 6.3 09/16/2022   HGBA1C 6.2 10/02/2021          Return if symptoms worsen or fail to improve.    Subjective:    Chief Complaint  Patient presents with   Night Sweats    Pt in office regarding symptoms including night sweats, no fever, several Neg Covid tests; pt is sweating a lot in general; pt pushing fluids and also admits to playing tennis with no issues.    HPI Discussed the use of AI scribe software for clinical note transcription with the patient, who gave verbal consent to proceed.  History of Present Illness Tanya [name] is a 63 year old female who presents with symptoms suggestive of a recent viral illness, possibly COVID-19.  Approximately a week ago, she began experiencing symptoms after attending a large social event in [place] , where several attendees later tested positive for COVID-19. She has felt 'off' and experienced pressure in her chest, though it was not debilitating.  She has been experiencing night sweats, describing them as waking up in a 'puddle,' and has noted increased sweating during the day, particularly during physical activity such as playing tennis. Her temperature reached 101F on Thursday and Friday but has since normalized.  She reports fatigue, requiring daily naps, and describes her sleep as unchanged, with continued bizarre dreams. Initially, she felt extremely tired, but now she is able to engage in activities like tennis, although she sweats more easily.  She has experienced a cough triggered by deep breaths, which has been persistent.  No exertional difficulty or significant pain is noted, but she mentions occasional headaches, for which she takes ibuprofen as needed.  She occasionally feels nauseous in the mornings, but denies diarrhea or urinary symptoms.     Past Medical History:  Diagnosis Date   AAA (abdominal aortic aneurysm) (HCC) ~2021   checked based on family history   Allergy    Asthma    child hood asthma  ONLY    History of chickenpox    Hx of  adenomatous polyp of colon 11/27/2015   Hypertension    under control with meds    Plantar fasciitis    Vaginal tear resulting from childbirth    occ will bleed after large bm with this     Past Surgical History:  Procedure Laterality Date   CESAREAN SECTION  1997   COLONOSCOPY     VAGINAL DELIVERY     x2   WISDOM TOOTH EXTRACTION      Family History  Problem Relation Age of Onset   Aortic aneurysm Mother    Diabetes Maternal Grandmother    Aortic aneurysm Brother    Colon cancer Neg Hx    Colon polyps Neg Hx    Esophageal cancer Neg Hx    Rectal cancer Neg Hx    Stomach cancer Neg Hx     Social History   Tobacco Use   Smoking status: Never   Smokeless tobacco: Never  Vaping Use   Vaping status: Never Used  Substance Use Topics   Alcohol use: Yes    Alcohol/week: 2.0 standard drinks of alcohol    Types: 2 Glasses of wine per week    Comment: wine socially   Drug use: No     No Known Allergies  Review of Systems NEGATIVE UNLESS OTHERWISE INDICATED IN HPI      Objective:     BP 120/80 (BP Location: Left Arm, Patient Position: Sitting, Cuff Size: Normal)   Pulse 75   Temp (!) 97.2 F (36.2 C) (Temporal)   Ht 5' 4 (1.626 m)   Wt 188 lb 9.6 oz (85.5 kg)   LMP 08/25/2015 (Approximate)   SpO2 95%   BMI 32.37 kg/m   Wt Readings from Last 3 Encounters:  08/02/24 188 lb 9.6 oz (85.5 kg)  05/02/24 193 lb (87.5 kg)  10/25/23 193 lb (87.5 kg)    BP Readings from Last 3 Encounters:  08/02/24 120/80  10/25/23 128/84  10/19/23 120/80     Physical Exam Vitals and nursing note reviewed.  Constitutional:      General: She is not in acute distress.    Appearance: Normal appearance. She is not ill-appearing.  HENT:     Head: Normocephalic.     Right Ear: Tympanic membrane, ear canal and external ear normal.     Left Ear: Tympanic membrane, ear canal and external ear normal.     Nose: No congestion.     Mouth/Throat:     Mouth: Mucous membranes are  moist.     Pharynx: No oropharyngeal exudate or posterior oropharyngeal erythema.  Eyes:     Extraocular Movements: Extraocular movements intact.     Conjunctiva/sclera: Conjunctivae normal.     Pupils: Pupils are equal, round, and reactive to light.  Cardiovascular:     Rate and Rhythm: Normal rate and regular rhythm.     Pulses: Normal pulses.     Heart sounds: Normal heart sounds. No murmur heard. Pulmonary:     Effort: Pulmonary effort is  normal. No respiratory distress.     Breath sounds: Normal breath sounds. No wheezing.  Abdominal:     General: Abdomen is flat. Bowel sounds are normal.     Palpations: Abdomen is soft.  Musculoskeletal:     Cervical back: Normal range of motion.  Skin:    General: Skin is warm.     Findings: No lesion or rash.  Neurological:     Mental Status: She is alert and oriented to person, place, and time.  Psychiatric:        Mood and Affect: Mood normal.        Behavior: Behavior normal.             Ardeth Repetto M Olman Yono, PA-C

## 2024-08-03 ENCOUNTER — Ambulatory Visit

## 2024-08-03 ENCOUNTER — Other Ambulatory Visit: Payer: Self-pay

## 2024-08-03 DIAGNOSIS — R0789 Other chest pain: Secondary | ICD-10-CM

## 2024-08-03 DIAGNOSIS — R9389 Abnormal findings on diagnostic imaging of other specified body structures: Secondary | ICD-10-CM

## 2024-08-07 ENCOUNTER — Other Ambulatory Visit: Payer: Self-pay | Admitting: Physician Assistant

## 2024-08-07 DIAGNOSIS — R945 Abnormal results of liver function studies: Secondary | ICD-10-CM

## 2024-08-07 NOTE — Telephone Encounter (Signed)
 Please see pt reply and advise on any future labs needed to be placed

## 2024-08-09 ENCOUNTER — Other Ambulatory Visit (INDEPENDENT_AMBULATORY_CARE_PROVIDER_SITE_OTHER)

## 2024-08-09 ENCOUNTER — Ambulatory Visit: Payer: Self-pay | Admitting: Physician Assistant

## 2024-08-09 DIAGNOSIS — R945 Abnormal results of liver function studies: Secondary | ICD-10-CM | POA: Diagnosis not present

## 2024-08-09 LAB — HEPATIC FUNCTION PANEL
ALT: 34 U/L (ref 0–35)
AST: 20 U/L (ref 0–37)
Albumin: 4.3 g/dL (ref 3.5–5.2)
Alkaline Phosphatase: 94 U/L (ref 39–117)
Bilirubin, Direct: 0.1 mg/dL (ref 0.0–0.3)
Total Bilirubin: 0.6 mg/dL (ref 0.2–1.2)
Total Protein: 7.8 g/dL (ref 6.0–8.3)

## 2024-10-20 ENCOUNTER — Other Ambulatory Visit: Payer: Self-pay

## 2024-10-20 LAB — HM MAMMOGRAPHY

## 2024-10-21 ENCOUNTER — Encounter: Payer: Self-pay | Admitting: Physician Assistant

## 2024-10-23 LAB — SURGICAL PATHOLOGY

## 2024-10-25 ENCOUNTER — Telehealth: Payer: Self-pay | Admitting: *Deleted

## 2024-10-25 ENCOUNTER — Encounter: Payer: Self-pay | Admitting: Physician Assistant

## 2024-10-25 ENCOUNTER — Ambulatory Visit: Payer: 59 | Admitting: Physician Assistant

## 2024-10-25 VITALS — BP 126/82 | HR 70 | Temp 98.1°F | Ht 63.78 in | Wt 190.6 lb

## 2024-10-25 DIAGNOSIS — E1165 Type 2 diabetes mellitus with hyperglycemia: Secondary | ICD-10-CM

## 2024-10-25 DIAGNOSIS — E782 Mixed hyperlipidemia: Secondary | ICD-10-CM | POA: Diagnosis not present

## 2024-10-25 DIAGNOSIS — Z0000 Encounter for general adult medical examination without abnormal findings: Secondary | ICD-10-CM

## 2024-10-25 DIAGNOSIS — I7121 Aneurysm of the ascending aorta, without rupture: Secondary | ICD-10-CM | POA: Diagnosis not present

## 2024-10-25 DIAGNOSIS — C50912 Malignant neoplasm of unspecified site of left female breast: Secondary | ICD-10-CM | POA: Diagnosis not present

## 2024-10-25 NOTE — Telephone Encounter (Signed)
 Spoke to patient to confirm upcoming afternoon [facility] clinic appointment on 12/17, paperwork will be sent via [facility] location and time, also informed patient that the surgeon's office would be calling as well to get information from them similar to the packet that they will be receiving so make sure to do both.  Reminded patient that all providers will be coming to the clinic to see them HERE and if they had any questions to not hesitate to reach back out to myself or their navigators.

## 2024-10-25 NOTE — Progress Notes (Signed)
 Patient ID: Tanya [name], female    DOB: [birth year], 62 y.o.   MRN: [id number]   Assessment & Plan:  Encounter for annual physical exam  Type 2 diabetes mellitus with hyperglycemia, without long-term current use of insulin (HCC) -     Hemoglobin A1c; Future  Mixed hyperlipidemia -     Lipid panel; Future  Aneurysm of ascending aorta without rupture -     CT ANGIO CHEST AORTA W/CM & OR WO/CM; Future  Ductal carcinoma of left breast (HCC)      Assessment & Plan Invasive ductal carcinoma of the left breast Recently diagnosed with invasive ductal carcinoma of the left breast, approximately the size of a pea. Awaiting receptor status results to guide treatment options. She is informed about potential treatment paths, including surgery, chemotherapy, and radiation, and prefers to avoid mastectomy if possible. - Await receptor status results to guide treatment options - Follow up with oncology team next Tuesday for further discussion and treatment planning  Type 2 diabetes mellitus with hyperglycemia Type 2 diabetes with recent increase in A1c from 6.3 to 7.1. Possible influence of recent illness on glucose levels. She is active and maintains a healthy lifestyle.  - Monitor blood glucose levels and dietary intake - Consider Metformin if next A1c check is still elevated above 7.0  Lab Results  Component Value Date   HGBA1C 7.1 (H) 08/02/2024   HGBA1C 6.3 10/20/2023   HGBA1C 6.3 09/16/2022    Mixed hyperlipidemia Previous intolerance to statins due to joint pain. Maintains a low cholesterol diet and is active. Recent coronary calcium score of zero indicates low cardiovascular risk. She prefers to avoid statins due to past adverse effects. - Ordered cholesterol panel  - Continue low cholesterol diet and regular physical activity  Ascending aortic aneurysm without rupture Ascending aortic aneurysm with annual monitoring.  - Ordered annual aortic imaging for February  2026  General Health Maintenance Up to date with colon cancer screening, vaccines, and COVID vaccination. Active lifestyle maintained. - Continue routine health maintenance and screenings - Encouraged continued physical activity and healthy diet  Patient Counseling: [x]   Nutrition: Stressed importance of moderation in sodium/caffeine intake, saturated fat and cholesterol, caloric balance, sufficient intake of fresh fruits, vegetables, and fiber.  [x]   Stressed the importance of regular exercise.   []   Substance Abuse: Discussed cessation/primary prevention of tobacco, alcohol, or other drug use; driving or other dangerous activities under the influence; availability of treatment for abuse.   [x]   Injury prevention: Discussed safety belts, safety helmets, smoke detector, smoking near bedding or upholstery.   []   Sexuality: Discussed sexually transmitted diseases, partner selection, use of condoms, avoidance of unintended pregnancy  and contraceptive alternatives.   [x]   Dental health: Discussed importance of regular tooth brushing, flossing, and dental visits.  [x]   Health maintenance and immunizations reviewed. Please refer to Health maintenance section.       Fasting labs in January  F/up with Asal Teas in Summer 2026     Subjective:    Chief Complaint  Patient presents with   Annual Exam    Pt in office for annual CPE, labs previously completed in September; pt states she is in the process of being diagnosed with breast cancer, biopsy completed and came back as cancer;     HPI Discussed the use of AI scribe software for clinical note transcription with the patient, who gave verbal consent to proceed.  History of Present Illness Tanya [name] is a 63 year  old female who presents for her annual physical exam with a recent diagnosis of invasive ductal carcinoma of the left breast.  She was diagnosed with invasive ductal carcinoma of the left breast following a biopsy on December  5th. The mass is approximately the size of a pea. She is awaiting further results regarding her receptor status and has an upcoming appointment next Tuesday to discuss her treatment plan.  She has a history of pneumonia diagnosed in September, which resolved after a course of antibiotics. During that time, she experienced fatigue, lack of appetite, and hot flashes, but notably did not have a cough. She was able to resume playing tennis twice a week after treatment, indicating a return of energy.  She has prediabetes, with her A1c increasing from 6.3 to 7.1. She is awaiting further testing in December due to insurance restrictions. Despite being active and maintaining a healthy diet, she is concerned about her blood sugar levels.  Her cholesterol levels have been a concern, although she maintains a low cholesterol diet. She previously tried a statin, which caused joint pain, and she prefers to avoid it due to her active lifestyle. Her coronary calcium score was zero in July.  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 She also discusses her family's history of cholesterol issues, particularly her husband's early onset of cholesterol problems.  She is planning to travel to [place]  for skiing and is coordinating her medical appointments around this trip.      Past Medical History:  Diagnosis Date   AAA (abdominal aortic aneurysm) ~2021   checked based on family history   Allergy    Asthma    child hood asthma  ONLY    History of chickenpox    Hx of adenomatous polyp of colon 11/27/2015   Hypertension    under control with meds    Plantar fasciitis    Vaginal tear resulting from childbirth    occ will bleed after large bm with this     Past Surgical History:  Procedure Laterality Date   CESAREAN SECTION  1997   COLONOSCOPY     VAGINAL DELIVERY     x2   WISDOM TOOTH EXTRACTION      Family History  Problem Relation Age of Onset   Aortic  aneurysm Mother    Diabetes Maternal Grandmother    Aortic aneurysm Brother    Colon cancer Neg Hx    Colon polyps Neg Hx    Esophageal cancer Neg Hx    Rectal cancer Neg Hx    Stomach cancer Neg Hx     Social History   Tobacco Use   Smoking status: Never   Smokeless tobacco: Never  Vaping Use   Vaping status: Never Used  Substance Use Topics   Alcohol use: Yes    Alcohol/week: 2.0 standard drinks of alcohol    Types: 2 Glasses of wine per week    Comment: wine socially   Drug use: Never     No Known Allergies  Review of Systems NEGATIVE UNLESS OTHERWISE INDICATED IN HPI      Objective:     BP 126/82 (BP Location: Left Arm, Patient Position: Sitting, Cuff Size: Normal)   Pulse 70   Temp 98.1 F (36.7 C) (Temporal)   Ht 5' 3.78 (1.62 m)   Wt 190 lb 9.6 oz (86.5 kg)   LMP 08/25/2015 (Approximate)   SpO2 99%   BMI 32.94 kg/m  Wt Readings from Last 3 Encounters:  10/25/24 190 lb 9.6 oz (86.5 kg)  08/02/24 188 lb 9.6 oz (85.5 kg)  05/02/24 193 lb (87.5 kg)    BP Readings from Last 3 Encounters:  10/25/24 126/82  08/02/24 120/80  10/25/23 128/84     Physical Exam Vitals and nursing note reviewed.  Constitutional:      General: She is not in acute distress.    Appearance: Normal appearance. She is not ill-appearing.  HENT:     Head: Normocephalic.     Right Ear: Tympanic membrane, ear canal and external ear normal.     Left Ear: Tympanic membrane, ear canal and external ear normal.     Nose: No congestion.     Mouth/Throat:     Mouth: Mucous membranes are moist.     Pharynx: No oropharyngeal exudate or posterior oropharyngeal erythema.  Eyes:     Extraocular Movements: Extraocular movements intact.     Conjunctiva/sclera: Conjunctivae normal.     Pupils: Pupils are equal, round, and reactive to light.  Cardiovascular:     Rate and Rhythm: Normal rate and regular rhythm.     Pulses: Normal pulses.     Heart sounds: Normal heart sounds. No murmur  heard. Pulmonary:     Effort: Pulmonary effort is normal. No respiratory distress.     Breath sounds: Normal breath sounds. No wheezing.  Abdominal:     General: Abdomen is flat. Bowel sounds are normal.     Palpations: Abdomen is soft.  Musculoskeletal:     Cervical back: Normal range of motion.  Skin:    General: Skin is warm.     Findings: No lesion or rash.  Neurological:     Mental Status: She is alert and oriented to person, place, and time.  Psychiatric:        Mood and Affect: Mood normal.        Behavior: Behavior normal.             Aricka Goldberger M Teejay Meader, PA-C

## 2024-10-25 NOTE — Telephone Encounter (Signed)
 LVM to patient in reference to upcoming [facility] appointment on 12/17, left my contact to call back

## 2024-10-25 NOTE — Patient Instructions (Addendum)
 Fasting labs in January  F/up with Jakarri Lesko in Summer 2026

## 2024-10-26 ENCOUNTER — Encounter: Payer: Self-pay | Admitting: Gynecology

## 2024-10-30 ENCOUNTER — Encounter: Payer: Self-pay | Admitting: *Deleted

## 2024-10-30 DIAGNOSIS — C50212 Malignant neoplasm of upper-inner quadrant of left female breast: Secondary | ICD-10-CM | POA: Insufficient documentation

## 2024-10-31 ENCOUNTER — Encounter: Payer: Self-pay | Admitting: Gynecology

## 2024-11-01 ENCOUNTER — Inpatient Hospital Stay: Admitting: Genetic Counselor

## 2024-11-01 ENCOUNTER — Encounter: Payer: Self-pay | Admitting: Physical Therapy

## 2024-11-01 ENCOUNTER — Inpatient Hospital Stay

## 2024-11-01 ENCOUNTER — Ambulatory Visit
Admission: RE | Admit: 2024-11-01 | Discharge: 2024-11-01 | Attending: Radiation Oncology | Admitting: Radiation Oncology

## 2024-11-01 ENCOUNTER — Encounter: Payer: Self-pay | Admitting: Genetic Counselor

## 2024-11-01 ENCOUNTER — Other Ambulatory Visit: Payer: Self-pay

## 2024-11-01 ENCOUNTER — Encounter: Payer: Self-pay | Admitting: *Deleted

## 2024-11-01 ENCOUNTER — Inpatient Hospital Stay: Attending: Hematology | Admitting: Hematology

## 2024-11-01 ENCOUNTER — Other Ambulatory Visit: Payer: Self-pay | Admitting: General Surgery

## 2024-11-01 ENCOUNTER — Ambulatory Visit: Admitting: Physical Therapy

## 2024-11-01 ENCOUNTER — Encounter: Payer: Self-pay | Admitting: Hematology

## 2024-11-01 VITALS — BP 122/71 | HR 87 | Temp 97.7°F | Resp 18 | Ht 63.78 in | Wt 194.0 lb

## 2024-11-01 DIAGNOSIS — Z1731 Human epidermal growth factor receptor 2 positive status: Secondary | ICD-10-CM | POA: Insufficient documentation

## 2024-11-01 DIAGNOSIS — Z79899 Other long term (current) drug therapy: Secondary | ICD-10-CM | POA: Diagnosis not present

## 2024-11-01 DIAGNOSIS — Z171 Estrogen receptor negative status [ER-]: Secondary | ICD-10-CM

## 2024-11-01 DIAGNOSIS — Z1722 Progesterone receptor negative status: Secondary | ICD-10-CM | POA: Diagnosis not present

## 2024-11-01 DIAGNOSIS — Z860101 Personal history of adenomatous and serrated colon polyps: Secondary | ICD-10-CM | POA: Diagnosis not present

## 2024-11-01 DIAGNOSIS — Z806 Family history of leukemia: Secondary | ICD-10-CM | POA: Diagnosis not present

## 2024-11-01 DIAGNOSIS — R293 Abnormal posture: Secondary | ICD-10-CM | POA: Diagnosis present

## 2024-11-01 DIAGNOSIS — I10 Essential (primary) hypertension: Secondary | ICD-10-CM | POA: Insufficient documentation

## 2024-11-01 DIAGNOSIS — Z7982 Long term (current) use of aspirin: Secondary | ICD-10-CM | POA: Diagnosis not present

## 2024-11-01 DIAGNOSIS — C50212 Malignant neoplasm of upper-inner quadrant of left female breast: Secondary | ICD-10-CM | POA: Insufficient documentation

## 2024-11-01 DIAGNOSIS — Z1379 Encounter for other screening for genetic and chromosomal anomalies: Secondary | ICD-10-CM

## 2024-11-01 LAB — CMP (CANCER CENTER ONLY)
ALT: 31 U/L (ref 0–44)
AST: 26 U/L (ref 15–41)
Albumin: 4.8 g/dL (ref 3.5–5.0)
Alkaline Phosphatase: 81 U/L (ref 38–126)
Anion gap: 12 (ref 5–15)
BUN: 18 mg/dL (ref 8–23)
CO2: 24 mmol/L (ref 22–32)
Calcium: 10.1 mg/dL (ref 8.9–10.3)
Chloride: 103 mmol/L (ref 98–111)
Creatinine: 0.92 mg/dL (ref 0.44–1.00)
GFR, Estimated: >60 mL/min (ref >60)
Glucose, Bld: 159 mg/dL — ABNORMAL HIGH (ref 70–99)
Potassium: 4.3 mmol/L (ref 3.5–5.1)
Sodium: 139 mmol/L (ref 135–145)
Total Bilirubin: 0.3 mg/dL (ref 0.0–1.2)
Total Protein: 8 g/dL (ref 6.5–8.1)

## 2024-11-01 LAB — CBC WITH DIFFERENTIAL (CANCER CENTER ONLY)
Abs Immature Granulocytes: 0.02 K/uL (ref 0.00–0.07)
Basophils Absolute: 0 K/uL (ref 0.0–0.1)
Basophils Relative: 1 %
Eosinophils Absolute: 0 K/uL (ref 0.0–0.5)
Eosinophils Relative: 1 %
HCT: 42.2 % (ref 36.0–46.0)
Hemoglobin: 14.7 g/dL (ref 12.0–15.0)
Immature Granulocytes: 0 %
Lymphocytes Relative: 40 %
Lymphs Abs: 2.6 K/uL (ref 0.7–4.0)
MCH: 31.3 pg (ref 26.0–34.0)
MCHC: 34.8 g/dL (ref 30.0–36.0)
MCV: 90 fL (ref 80.0–100.0)
Monocytes Absolute: 0.5 K/uL (ref 0.1–1.0)
Monocytes Relative: 7 %
Neutro Abs: 3.4 K/uL (ref 1.7–7.7)
Neutrophils Relative %: 51 %
Platelet Count: 246 K/uL (ref 150–400)
RBC: 4.69 MIL/uL (ref 3.87–5.11)
RDW: 12.7 % (ref 11.5–15.5)
WBC Count: 6.5 K/uL (ref 4.0–10.5)
nRBC: 0 % (ref 0.0–0.2)

## 2024-11-01 LAB — GENETIC SCREENING ORDER

## 2024-11-01 NOTE — Research (Signed)
 [employer] 2021-05 - Specimen Collection Study to Evaluate Biomarkers in Subjects with Cancer    Patient Tanya [name] was identified by Dr. Lanny as a potential candidate for the above listed study.  This Clinical Research Coordinator met with Tanya [name], [id number], on 11/01/2024 in a manner and location that ensures patient privacy to discuss participation in the above listed research study.  Patient is Accompanied by her husband.  A copy of the informed consent document with embedded HIPAA language was provided to the patient.  Patient reads, speaks, and understands English.    Patient was provided with the business card of this Coordinator and encouraged to contact the research team with any questions.  Approximately 10 minutes were spent with the patient reviewing the informed consent documents.  Patient was provided the option of taking informed consent documents home to review and was encouraged to review at their convenience with their support network, including other care providers. Patient took the consent documents home to review.  Tanya [name] will be in [place]  next week. Pt agreed that research team can follow up with her any time on 11/20/2024. Tanya [name] is also interested in the non-pt cohort to support the study and his wife. Tanya [name] were thanked for their time and interest in the study. They know to contact research team any time with any questions.   Abelardo Jock Clinical Research Coordinator [phone] 11/01/2024 4:13 PM

## 2024-11-01 NOTE — Progress Notes (Signed)
 [facility]   Telephone:(336) [phone] Fax:(336) [phone]   Clinic New Consult Note   Patient Care Team: Allwardt, Mardy HERO, PA-C as PCP - General (Physician Assistant) Key, Hargis HERO, NP as Nurse Practitioner (Gynecology) Mammography, [facility] (Diagnostic Radiology) Tyree Nanetta SAILOR, RN as Oncology Nurse Navigator Aron Shoulders, MD as Consulting Physician (General Surgery) Lanny Callander, MD as Consulting Physician (Hematology) Dewey Rush, MD as Consulting Physician (Radiation Oncology) 11/01/2024  CHIEF COMPLAINTS/PURPOSE OF CONSULTATION:  Newly diagnosed left breast cancer  REFERRING PHYSICIAN: [facility]  Discussed the use of AI scribe software for clinical note transcription with the patient, who gave verbal consent to proceed.  History of Present Illness Tanya [name] is a 63 year old female with newly diagnosed stage IA, grade 3, HER2-positive, ER/PR-negative invasive ductal carcinoma of the left breast who presents for initial oncology consultation to discuss management and treatment planning.  She presents to our multidisciplinary breast clinic with her husband today.  Breast cancer was detected on routine screening mammogram without prior abnormal imaging or breast biopsies.  Her mammogram and ultrasound showed a 0.8 cm mass at 10 o'clock position of left breast, 4 cm from nipple.  Axilla was negative by ultrasound.  She underwent biopsy of the breast mass, which showed grade 3 invasive ductal carcinoma, both ER/PR negative, HER2 positive, with Ki-67 40%.  She had no palpable mass or breast changes before the mammogram. After core needle biopsy on October 20, 2024, she developed mild localized soreness at the biopsy site and notes the current lump only in that area, which she attributes to post-biopsy change.  She is otherwise asymptomatic, with only mild localized post-biopsy soreness. She denies pain, arthralgias, constitutional symptoms, or changes in energy, appetite,  or weight. She is physically active and feels her overall health is good.      MEDICAL HISTORY:  Past Medical History:  Diagnosis Date   AAA (abdominal aortic aneurysm) ~2021   checked based on family history   Allergy    Asthma    child hood asthma  ONLY    Family history of leukemia    History of chickenpox    Hx of adenomatous polyp of colon 11/27/2015   Hypertension    under control with meds    Plantar fasciitis    Vaginal tear resulting from childbirth    occ will bleed after large bm with this     SURGICAL HISTORY: Past Surgical History:  Procedure Laterality Date   CESAREAN SECTION  1997   COLONOSCOPY     VAGINAL DELIVERY     x2   WISDOM TOOTH EXTRACTION      SOCIAL HISTORY: Social History   Socioeconomic History   Marital status: Married    Spouse name: Not on file   Number of children: 3   Years of education: Not on file   Highest education level: Master's degree (e.g., MA, MS, MEng, MEd, MSW, MBA)  Occupational History   Not on file  Tobacco Use   Smoking status: Never   Smokeless tobacco: Never  Vaping Use   Vaping status: Never Used  Substance and Sexual Activity   Alcohol use: Yes    Alcohol/week: 2.0 standard drinks of alcohol    Types: 2 Glasses of wine per week    Comment: wine socially   Drug use: Never   Sexual activity: Yes    Birth control/protection: Post-menopausal  Other Topics Concern   Not on file  Social History Narrative   Not on file  Social Drivers of Health   Tobacco Use: Low Risk (11/01/2024)   Patient History    Smoking Tobacco Use: Never    Smokeless Tobacco Use: Never    Passive Exposure: Not on file  Financial Resource Strain: Patient Declined (10/21/2024)   Overall Financial Resource Strain (CARDIA)    Difficulty of Paying Living Expenses: Patient declined  Food Insecurity: Patient Declined (10/21/2024)   Epic    Worried About Programme Researcher, Broadcasting/film/video in the Last Year: Patient declined    Barista in the  Last Year: Patient declined  Transportation Needs: No Transportation Needs (10/21/2024)   Epic    Lack of Transportation (Medical): No    Lack of Transportation (Non-Medical): No  Physical Activity: Sufficiently Active (10/21/2024)   Exercise Vital Sign    Days of Exercise per Week: 4 days    Minutes of Exercise per Session: 90 min  Stress: No Stress Concern Present (10/21/2024)   [employer] of Occupational Health - Occupational Stress Questionnaire    Feeling of Stress: Only a little  Social Connections: Moderately Integrated (10/21/2024)   Social Connection and Isolation Panel    Frequency of Communication with Friends and Family: Three times a week    Frequency of Social Gatherings with Friends and Family: Three times a week    Attends Religious Services: Patient declined    Active Member of Clubs or Organizations: Yes    Attends Banker Meetings: 1 to 4 times per year    Marital Status: Married  Catering Manager Violence: Not on file  Depression (PHQ2-9): Low Risk (10/25/2024)   Depression (PHQ2-9)    PHQ-2 Score: 0  Alcohol Screen: Low Risk (04/25/2024)   Alcohol Screen    Last Alcohol Screening Score (AUDIT): 4  Housing: Unknown (11/01/2024)   Received from [facility]   Epic    Unable to Pay for Housing in the Last Year: Not on file    Number of Times Moved in the Last Year: Not on file    At any time in the past 12 months, were you homeless or living in a shelter (including now)?: No  Utilities: Not on file  Health Literacy: Not on file    FAMILY HISTORY: Family History  Problem Relation Age of Onset   Aortic aneurysm Mother    Leukemia Father    Aortic aneurysm Brother    Diabetes Maternal Grandmother    Colon cancer Neg Hx    Colon polyps Neg Hx    Esophageal cancer Neg Hx    Rectal cancer Neg Hx    Stomach cancer Neg Hx     ALLERGIES:  has no known allergies.  MEDICATIONS:  Current Outpatient Medications  Medication  Sig Dispense Refill   aspirin EC 81 MG tablet Take 81 mg by mouth daily.     Calcium-Magnesium-Vitamin D (CALCIUM MAGNESIUM PO) Take by mouth.     cholecalciferol (VITAMIN D) 1000 UNITS tablet Take 1,000 Units by mouth daily.     losartan  (COZAAR ) 25 MG tablet Take 1 tablet (25 mg total) by mouth daily. 90 tablet 3   Multiple Vitamin (MULTIVITAMIN) tablet Take 1 tablet by mouth daily.     Nutritional Supplements (JUICE PLUS FIBRE PO) Take 1 tablet by mouth daily.     Omega-3 Fatty Acids (FISH OIL) 1200 MG CAPS Take 1 capsule daily by mouth.     No current facility-administered medications for this visit.    REVIEW OF SYSTEMS:  Constitutional: Denies fevers, chills or abnormal night sweats Eyes: Denies blurriness of vision, double vision or watery eyes Ears, nose, mouth, throat, and face: Denies mucositis or sore throat Respiratory: Denies cough, dyspnea or wheezes Cardiovascular: Denies palpitation, chest discomfort or lower extremity swelling Gastrointestinal:  Denies nausea, heartburn or change in bowel habits Skin: Denies abnormal skin rashes Lymphatics: Denies new lymphadenopathy or easy bruising Neurological:Denies numbness, tingling or new weaknesses Behavioral/Psych: Mood is stable, no new changes  All other systems were reviewed with the patient and are negative.  PHYSICAL EXAMINATION: ECOG PERFORMANCE STATUS: 0 - Asymptomatic  Vitals:   11/01/24 1250  BP: 122/71  Pulse: 87  Resp: 18  Temp: 97.7 F (36.5 C)  SpO2: 95%   Filed Weights   11/01/24 1250  Weight: 194 lb (88 kg)    GENERAL:alert, no distress and comfortable SKIN: skin color, texture, turgor are normal, no rashes or significant lesions EYES: normal, conjunctiva are pink and non-injected, sclera clear OROPHARYNX:no exudate, no erythema and lips, buccal mucosa, and tongue normal  NECK: supple, thyroid normal size, non-tender, without nodularity LYMPH:  no palpable lymphadenopathy in the cervical,  axillary or inguinal LUNGS: clear to auscultation and percussion with normal breathing effort HEART: regular rate & rhythm and no murmurs and no lower extremity edema ABDOMEN:abdomen soft, non-tender and normal bowel sounds Musculoskeletal:no cyanosis of digits and no clubbing  PSYCH: alert & oriented x 3 with fluent speech NEURO: no focal motor/sensory deficits Breasts: Breast inspection showed them to be symmetrical with no nipple discharge. Palpation of the breasts and axilla revealed a 1.5 cm mass at the 10 o'clock position of left breast, no other obvious mass that I could appreciate.   Physical Exam   LABORATORY DATA:  I have reviewed the data as listed    Latest Ref Rng & Units 11/01/2024   12:34 PM 08/02/2024   10:23 AM 10/20/2023    9:50 AM  CBC  WBC 4.0 - 10.5 K/uL 6.5  4.7  4.8   Hemoglobin 12.0 - 15.0 g/dL 85.2  86.9  85.6   Hematocrit 36.0 - 46.0 % 42.2  39.5  43.4   Platelets 150 - 400 K/uL 246  386.0  222.0       Latest Ref Rng & Units 11/01/2024   12:34 PM 08/09/2024    9:04 AM 08/02/2024   10:23 AM  CMP  Glucose 70 - 99 mg/dL 840   873   BUN 8 - 23 mg/dL 18   17   Creatinine 9.55 - 1.00 mg/dL 9.07   9.22   Sodium 864 - 145 mmol/L 139   138   Potassium 3.5 - 5.1 mmol/L 4.3   4.2   Chloride 98 - 111 mmol/L 103   100   CO2 22 - 32 mmol/L 24   26   Calcium 8.9 - 10.3 mg/dL 89.8   89.7   Total Protein 6.5 - 8.1 g/dL 8.0  7.8  7.8   Total Bilirubin 0.0 - 1.2 mg/dL 0.3  0.6  0.5   Alkaline Phos 38 - 126 U/L 81  94  137   AST 15 - 41 U/L 26  20  51   ALT 0 - 44 U/L 31  34  102      RADIOGRAPHIC STUDIES: I have personally reviewed the radiological images as listed and agreed with the findings in the report. No results found.  Assessment & Plan Malignant neoplasm of upper inner quadrant of left breast, invasive ductal  carcinoma, [id number], stage IA,ER-/PR-/HER2+, G3 Newly diagnosed, early-stage (IA), high-grade, HER2-positive, ER/PR-negative invasive ductal  carcinoma of the left breast (8-9 mm). Tumor demonstrates aggressive features (grade 3, high Ki-67, hormone receptor negativity, HER2 positivity) without clinical or radiographic evidence of nodal involvement or distant metastasis. No comorbidities impacting treatment.  -She was seen by breast surgeon Dr. Aron today, plan to proceed with lumpectomy and sentinel lymph node biopsy soon. -I recommend Adjuvant chemotherapy with weekly paclitaxel or every 3-week paclitaxel for 12 weeks, and anti-HER2 therapy indicated due to aggressive biology and hormone receptor negativity. Anticipated regimen is tolerable; expected adverse effects include fatigue, anorexia, and alopecia. Discussed alternative chemotherapy regimen if her tumor is more than 2 cm on surgical pathology. - Ordered port-a-cath placement for chemotherapy administration, to be performed at time of breast surgery. - Planned adjuvant chemotherapy with weekly paclitaxel (Taxol) for 12 weeks, to begin 3-4 weeks post-operatively pending surgical recovery and final pathology. - Planned adjuvant trastuzumab (Herceptin) for one year, to be initiated concurrently with paclitaxel and continued after chemotherapy completion. - Ordered genetic counseling and testing for hereditary breast cancer syndromes, pending consent and insurance approval. - Discussed DigniCap scalp cooling for prevention of chemotherapy-induced alopecia; provided information for consideration. - Discussed alternative every-3-week higher dose chemotherapy regimen; she will consider and inform at next visit. - Reviewed CBC and CMP, both within normal limits; no repeat required at this time. - Coordinated with breast navigator and genetic counselor for ongoing support and care coordination. - Plan to follow up 2 weeks post-operatively to review final pathology and finalize adjuvant treatment plan.   HTN - Continue medicine and follow-up with PCP  Plan - I reviewed her breast images  and biopsy results. - We discussed the treatment plan, including surgery, adjuvant chemotherapy, radiation and HER2 antibody therapy for 1 year. - Will proceed with lumpectomy and sentinel lymph node biopsy with Dr. Aron in the near future, along with a port placement during surgery - I will see her 2 weeks after surgery to finalize her adjuvant chemo. -- I discussed clinical trial exact science study, she is interested.   All questions were answered. The patient knows to call the clinic with any problems, questions or concerns. I spent 50 minutes counseling the patient face to face. The total time spent in the appointment was 60 minutes including review of chart and various tests results, discussions about plan of care and coordination of care plan.     Onita Mattock, MD 11/01/2024 3:34 PM

## 2024-11-01 NOTE — Progress Notes (Signed)
 REFERRING PROVIDER: Lanny Callander, MD [address] [place],  [place] [address]  PRIMARY PROVIDER:  Allwardt, Mardy HERO, PA-C  PRIMARY REASON FOR VISIT:  1. Family history of leukemia   2. Malignant neoplasm of upper-inner quadrant of left breast in female, estrogen receptor negative (HCC)      HISTORY OF PRESENT ILLNESS:   Ms. Dockendorf, a 63 y.o. female, was seen for a [facility] cancer genetics consultation at the request of Dr. Lanny due to a personal history of breast cancer.  Ms. Paone presents to clinic today to discuss the possibility of a hereditary predisposition to cancer, genetic testing, and to further clarify her future cancer risks, as well as potential cancer risks for family members.   In December 2025, at the age of 7, Ms. Geffrard was diagnosed with breast cancer. The treatment plan includes lumpectomy and sentinel node bx, chemotherapy and radiation.    CANCER HISTORY:  Oncology History  Malignant neoplasm of upper-inner quadrant of left breast in female, estrogen receptor negative (HCC)  10/20/2024 Cancer Staging   Staging form: Breast, AJCC 8th Edition - Clinical stage from 10/20/2024: Stage IA (cT1b, cN0, cM0, G3, ER-, PR-, HER2+) - Signed by Lanny Callander, MD on 11/01/2024 Stage prefix: Initial diagnosis Histologic grading system: 3 grade system   10/30/2024 Initial Diagnosis   Malignant neoplasm of upper-inner quadrant of left breast in female, estrogen receptor negative (HCC)     Past Medical History:  Diagnosis Date   AAA (abdominal aortic aneurysm) ~2021   checked based on family history   Allergy    Asthma    child hood asthma  ONLY    Family history of leukemia    History of chickenpox    Hx of adenomatous polyp of colon 11/27/2015   Hypertension    under control with meds    Plantar fasciitis    Vaginal tear resulting from childbirth    occ will bleed after large bm with this     Past Surgical History:  Procedure Laterality Date    CESAREAN SECTION  1997   COLONOSCOPY     VAGINAL DELIVERY     x2   WISDOM TOOTH EXTRACTION      Social History   Socioeconomic History   Marital status: Married    Spouse name: Not on file   Number of children: 3   Years of education: Not on file   Highest education level: Master's degree (e.g., MA, MS, MEng, MEd, MSW, MBA)  Occupational History   Not on file  Tobacco Use   Smoking status: Never   Smokeless tobacco: Never  Vaping Use   Vaping status: Never Used  Substance and Sexual Activity   Alcohol use: Yes    Alcohol/week: 2.0 standard drinks of alcohol    Types: 2 Glasses of wine per week    Comment: wine socially   Drug use: Never   Sexual activity: Yes    Birth control/protection: Post-menopausal  Other Topics Concern   Not on file  Social History Narrative   Not on file   Social Drivers of Health   Tobacco Use: Low Risk (11/01/2024)   Patient History    Smoking Tobacco Use: Never    Smokeless Tobacco Use: Never    Passive Exposure: Not on file  Financial Resource Strain: Patient Declined (10/21/2024)   Overall Financial Resource Strain (CARDIA)    Difficulty of Paying Living Expenses: Patient declined  Food Insecurity: Patient Declined (10/21/2024)   Epic  Worried About Programme Researcher, Broadcasting/film/video in the Last Year: Patient declined    Barista in the Last Year: Patient declined  Transportation Needs: No Transportation Needs (10/21/2024)   Epic    Lack of Transportation (Medical): No    Lack of Transportation (Non-Medical): No  Physical Activity: Sufficiently Active (10/21/2024)   Exercise Vital Sign    Days of Exercise per Week: 4 days    Minutes of Exercise per Session: 90 min  Stress: No Stress Concern Present (10/21/2024)   [employer] of Occupational Health - Occupational Stress Questionnaire    Feeling of Stress: Only a little  Social Connections: Moderately Integrated (10/21/2024)   Social Connection and Isolation Panel    Frequency of  Communication with Friends and Family: Three times a week    Frequency of Social Gatherings with Friends and Family: Three times a week    Attends Religious Services: Patient declined    Active Member of Clubs or Organizations: Yes    Attends Banker Meetings: 1 to 4 times per year    Marital Status: Married  Depression (PHQ2-9): Low Risk (10/25/2024)   Depression (PHQ2-9)    PHQ-2 Score: 0  Alcohol Screen: Low Risk (04/25/2024)   Alcohol Screen    Last Alcohol Screening Score (AUDIT): 4  Housing: Unknown (11/01/2024)   Received from [facility]   Epic    Unable to Pay for Housing in the Last Year: Not on file    Number of Times Moved in the Last Year: Not on file    At any time in the past 12 months, were you homeless or living in a shelter (including now)?: No  Utilities: Not on file  Health Literacy: Not on file     FAMILY HISTORY:  We obtained a detailed, 4-generation family history.  Significant diagnoses are listed below: Family History  Problem Relation Age of Onset   Aortic aneurysm Mother    Leukemia Father    Aortic aneurysm Brother    Diabetes Maternal Grandmother    Colon cancer Neg Hx    Colon polyps Neg Hx    Esophageal cancer Neg Hx    Rectal cancer Neg Hx    Stomach cancer Neg Hx      Significant family history of cancer reported includes: Father with leukemia  Ms. Schlatter is unaware of previous family history of genetic testing for hereditary cancer risks. There is no reported Ashkenazi Jewish ancestry. There is no known consanguinity.  GENETIC COUNSELING ASSESSMENT: Ms. Wee is a 63 y.o. female with a personal history of breast cancer. We, therefore, discussed and recommended the following at today's visit.   DISCUSSION: We discussed that, in general, most cancer is not inherited in families, but instead is sporadic or familial. Sporadic cancers occur by chance and typically happen at older ages (>50 years) as this  type of cancer is caused by genetic changes acquired during an individuals lifetime. Some families have more cancers than would be expected by chance; however, the ages or types of cancer are not consistent with a known genetic mutation or known genetic mutations have been ruled out. This type of familial cancer is thought to be due to a combination of multiple genetic, environmental, hormonal, and lifestyle factors. While this combination of factors likely increases the risk of cancer, the exact source of this risk is not currently identifiable or testable.  We discussed that 5 - 10% of breast cancer is hereditary, with most  cases associated with BRCA mutations.  There are other genes that can be associated with hereditary breast cancer syndromes.   We discussed that testing is beneficial for several reasons including knowing how to follow individuals after completing their treatment, identifying whether potential treatment options such as PARP inhibitors would be beneficial, and understand if other family members could be at risk for cancer and allow them to undergo genetic testing.   We reviewed the characteristics, features and inheritance patterns of hereditary cancer syndromes. We also discussed genetic testing, including the appropriate family members to test, the process of testing, insurance coverage and turn-around-time for results. We discussed the implications of a negative, positive, carrier and/or variant of uncertain significant result. . Ms. Eliasen decided to pursue genetic testing for the CancerNext-Expanded+RNAinsight gene panel.   The CancerNext-Expanded gene panel offered by [facility] and includes sequencing, rearrangement, and RNA analysis for the following 77 genes: AIP, ALK, APC, ATM, BAP1, BARD1, BMPR1A, BRCA1, BRCA2, BRIP1, CDC73, CDH1, CDK4, CDKN1B, CDKN2A, CEBPA, CHEK2, CTNNA1, DDX41, DICER1, ETV6, FH, FLCN, GATA2, LZTR1, MAX, MBD4, MEN1, MET, MLH1, MSH2, MSH3, MSH6, MUTYH,  NF1, NF2, NTHL1, PALB2, PHOX2B, PMS2, POT1, PRKAR1A, PTCH1, PTEN, RAD51C, RAD51D, RB1, RET, RPS20, RUNX1, SDHA, SDHAF2, SDHB, SDHC, SDHD, SMAD4, SMARCA4, SMARCB1, SMARCE1, STK11, SUFU, TMEM127, TP53, TSC1, TSC2, VHL, and WT1 (sequencing and deletion/duplication); AXIN2, CTNNA1, DDX41, EGFR, HOXB13, KIT, MBD4, MITF, MSH3, PDGFRA, POLD1 and POLE (sequencing only); EPCAM and GREM1 (deletion/duplication only). RNA data is routinely analyzed for use in variant interpretation for all genes.   Based on Ms. Vallin's personal history of cancer, she meets the Breast Surgeons criteria for genetic testing. Despite that she meets criteria, she may still have an out of pocket cost. We discussed that if her out of pocket cost for testing is over $100, the laboratory will call and confirm whether she wants to proceed with testing.  If the out of pocket cost of testing is less than $100 she will be billed by the genetic testing laboratory.   PLAN: After considering the risks, benefits, and limitations, Ms. Poteat provided informed consent to pursue genetic testing and the blood sample was sent to [facility] for analysis of the CancerNext-Expanded+RNAinsight. Results should be available within approximately 2-3 weeks' time, at which point they will be disclosed by telephone to Ms. Hoffer, as will any additional recommendations warranted by these results. Ms. Mayol will receive a summary of her genetic counseling visit and a copy of her results once available. This information will also be available in Epic.   Lastly, we encouraged Ms. Degnan to remain in contact with cancer genetics annually so that we can continuously update the family history and inform her of any changes in cancer genetics and testing that may be of benefit for this family.   Ms. Feldt questions were answered to her satisfaction today. Our contact information was provided should additional questions or concerns  arise. Thank you for the referral and allowing us  to share in the care of your patient.   Natalee Tomkiewicz P. Perri, MS, CGC Licensed, Patent Attorney [email or web address] phone: [phone]  I personally spent a total of 35 minutes in the care of the patient today including preparing to see the patient, getting/reviewing separately obtained history, counseling and educating, placing orders, referring and communicating with other health care professionals, and documenting clinical information in the EHR. The patient brought her husband. Drs. Lanny Stalls, and/or Gudena were available for questions, if needed..    _______________________________________________________________________ For Office Staff:  Number of people involved in session: 2 Was an Intern/ student involved with case: no

## 2024-11-01 NOTE — Therapy (Signed)
 OUTPATIENT PHYSICAL THERAPY BREAST CANCER BASELINE EVALUATION   Patient Name: Tanya [name] MRN: [id number] DOB:1961-02-04, 63 y.o., female Today's Date: 11/01/2024  END OF SESSION:  PT End of Session - 11/01/24 1403     Visit Number 1    Number of Visits 2    Date for Recertification  12/27/24    PT Start Time 1409    PT Stop Time 1420   Also saw pt from 1450-1517 for a total of 38 min   PT Time Calculation (min) 11 min    Activity Tolerance Patient tolerated treatment well    Behavior During Therapy [facility] for tasks assessed/performed          Past Medical History:  Diagnosis Date   AAA (abdominal aortic aneurysm) ~2021   checked based on family history   Allergy    Asthma    child hood asthma  ONLY    Family history of leukemia    History of chickenpox    Hx of adenomatous polyp of colon 11/27/2015   Hypertension    under control with meds    Plantar fasciitis    Vaginal tear resulting from childbirth    occ will bleed after large bm with this    Past Surgical History:  Procedure Laterality Date   CESAREAN SECTION  1997   COLONOSCOPY     VAGINAL DELIVERY     x2   WISDOM TOOTH EXTRACTION     Patient Active Problem List   Diagnosis Date Noted   Family history of leukemia    Malignant neoplasm of upper-inner quadrant of left breast in female, estrogen receptor negative (HCC) 10/30/2024   Aneurysm of ascending aorta without rupture 10/25/2024   Ductal carcinoma of left breast (HCC) 10/25/2024   Type 2 diabetes mellitus with hyperglycemia, without long-term current use of insulin (HCC) 09/16/2022   Mixed hyperlipidemia 09/16/2022   Abnormal cervical Papanicolaou smear 08/24/2017   Body mass index (BMI) 33.0-33.9, adult 08/24/2017   Menopausal syndrome 08/24/2017   Encounter for annual physical exam 08/24/2017   Need for hepatitis C screening test 08/24/2017   Essential hypertension 08/24/2017   Hx of adenomatous polyp of colon 11/27/2015    REFERRING  PROVIDER: Dr. [name]  REFERRING DIAG: Left breast cancer  THERAPY DIAG:  Malignant neoplasm of upper-inner quadrant of left breast in female, estrogen receptor negative (HCC)  Abnormal posture  Rationale for Evaluation and Treatment: Rehabilitation  ONSET DATE: 10/03/2024  SUBJECTIVE:  SUBJECTIVE STATEMENT: Patient reports she is here today to be seen by her medical team for her newly diagnosed left breast cancer.   PERTINENT HISTORY:  Patient was diagnosed on 10/03/2024 with left grade 3 invasive ductal carcinoma breast cancer. It measures 8 mm and is located in the upper inner quadrant. It is ER/PR negative and HER2 positive with a Ki67 of 40%.   PATIENT GOALS:   reduce lymphedema risk and learn post op HEP.   PAIN:  Are you having pain? No  PRECAUTIONS: Active CA   RED FLAGS: None   HAND DOMINANCE: right  WEIGHT BEARING RESTRICTIONS: No  FALLS:  Has patient fallen in last 6 months? 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  COGNITION: Overall cognitive status: Within functional limits for tasks assessed    POSTURE:  Forward head and rounded shoulders posture  UPPER EXTREMITY AROM/PROM:  A/PROM RIGHT   eval   Shoulder extension 57  Shoulder flexion 140  Shoulder abduction 136  Shoulder internal rotation 71  Shoulder external rotation 90    (Blank rows = not tested)  A/PROM LEFT   eval  Shoulder extension 49  Shoulder flexion  138  Shoulder abduction 161  Shoulder internal rotation 64  Shoulder external rotation 80    (Blank rows = not tested)  CERVICAL AROM: All within normal limits:    Percent limited  Flexion WNL  Extension WNL  Right lateral flexion WNL  Left lateral flexion WNL  Right rotation 25% limited  Left rotation 25% limited    UPPER EXTREMITY STRENGTH: WNL  LYMPHEDEMA ASSESSMENTS (in cm):   LANDMARK RIGHT   eval  10 cm proximal to olecranon process from proximal aspect of olecranon 31.7  Olecranon process 28  10 cm proximal to ulnar styloid process from proximal aspect of styloid process 26.6  Just distal to ulnar styloid process 16.7  Across hand at thumb web space 19.5  At base of 2nd digit 6.6  (Blank rows = not tested)  LANDMARK LEFT   eval  10 cm proximal to olecranon process from proximal aspect of olecranon 31.2   Olecranon process 26.1  10 cm proximal to ulnar styloid process from proximal aspect of styloid process 23.2  Just distal to ulnar styloid process 16  Across hand at thumb web space 19.8  At base of 2nd digit 6.5  (Blank rows = not tested)  L-DEX LYMPHEDEMA SCREENING:  The patient was assessed using the L-Dex machine today to produce a lymphedema index baseline score. The patient will be reassessed on a regular basis (typically every 3 months) to obtain new L-Dex scores. If the score is > 6.5 points away from his/her baseline score indicating onset of subclinical lymphedema, it will be recommended to wear a compression garment for 4 weeks, 12 hours per day and then be reassessed. If the score continues to be > 6.5 points from baseline at reassessment, we will initiate lymphedema treatment. Assessing in this manner has a 95% rate of preventing clinically significant lymphedema.   L-DEX FLOWSHEETS - 11/01/24 1400       L-DEX LYMPHEDEMA SCREENING   Measurement Type Unilateral    L-DEX MEASUREMENT EXTREMITY Upper Extremity  POSITION  Standing    DOMINANT SIDE  Right    At Risk Side Left    BASELINE SCORE (UNILATERAL) -0.4          QUICK DASH SURVEY:  Tanya [name] - 11/01/24 0001     Open a tight or new jar No difficulty    Do heavy household chores (wash walls, wash floors) No difficulty    Carry a shopping bag or briefcase No difficulty    Wash your back No difficulty    Use a knife to cut food No difficulty    Recreational activities in which you take some force or impact through your arm, shoulder, or hand (golf, hammering, tennis) No difficulty    During the past week, to what extent has your arm, shoulder or hand problem interfered with your normal social activities with family, friends, neighbors, or groups? Not at all    During the past week, to what extent has your arm, shoulder or hand problem limited your work or other regular daily activities Not at all    Arm, shoulder, or hand pain. None    Tingling (pins and needles) in your arm, shoulder, or hand None    Difficulty Sleeping No difficulty    DASH Score 0 %           PATIENT EDUCATION:  Education details: Time spent educating patient on aspects of self-care to maximize post op recovery. Patient was educated on where and how to get a post op compression bra to use to reduce post op edema. Patient was also educated on the use of SOZO screenings and surveillance principles for early identification of lymphedema onset. She was instructed to use the post op pillow in the axilla for pressure and pain relief. Patient educated on lymphedema risk reduction and post op shoulder/posture HEP. Person educated: Patient Education method: Explanation, Demonstration, Handout Education comprehension: Patient verbalized understanding and returned demonstration  HOME EXERCISE PROGRAM: Patient was instructed today in a home exercise program today for post op shoulder range of motion. These included active assist shoulder flexion in sitting, scapular retraction, wall walking with shoulder  abduction, and hands behind head external rotation.  She was encouraged to do these twice a day, holding 3 seconds and repeating 5 times when permitted by her physician.   ASSESSMENT:  CLINICAL IMPRESSION: Patient was diagnosed on 10/03/2024 with left grade 3 invasive ductal carcinoma breast cancer. It measures 8 mm and is located in the upper inner quadrant. It is ER/PR negative and HER2 positive with a Ki67 of 40%. Her multidisciplinary medical team met prior to her assessments to determine a recommended treatment plan. She is planning to have a left lumpectomy and sentinel node biopsy followed by chemotherapy and radiation. She will benefit from a post op PT reassessment to determine needs and from L-Dex screens every 3 months for 2 years to detect subclinical lymphedema.  Pt will benefit from skilled therapeutic intervention to improve on the following deficits: Decreased knowledge of precautions, impaired UE functional use, pain, decreased ROM, postural dysfunction.   PT treatment/interventions: ADL/self-care home management, pt/family education, therapeutic exercise  REHAB POTENTIAL: Excellent  CLINICAL DECISION MAKING: Stable/uncomplicated  EVALUATION COMPLEXITY: Low   GOALS: Goals reviewed with patient? YES  LONG TERM GOALS: (STG=LTG)    Name Target Date Goal status  1 Pt will be able to verbalize understanding of pertinent lymphedema risk reduction practices relevant to her dx specifically related to skin care.  Baseline:  No knowledge 11/01/2024 Achieved  at eval  2 Pt will be able to return demo and/or verbalize understanding of the post op HEP related to regaining shoulder ROM. Baseline:  No knowledge 11/01/2024 Achieved at eval  3 Pt will be able to verbalize understanding of the importance of viewing the post op After Breast CA Class video for further lymphedema risk reduction education and therapeutic exercise.  Baseline:  No knowledge 11/01/2024 Achieved at eval  4 Pt  will demo she has regained full shoulder ROM and function post operatively compared to baselines.  Baseline: See objective measurements taken today. 12/27/2024     PLAN:  PT FREQUENCY/DURATION: EVAL and 1 follow up appointment.   PLAN FOR NEXT SESSION: will reassess 3-4 weeks post op to determine needs.   Patient will follow up at outpatient cancer rehab 3-4 weeks following surgery.  If the patient requires physical therapy at that time, a specific plan will be dictated and sent to the referring physician for approval. The patient was educated today on appropriate basic range of motion exercises to begin post operatively and the importance of viewing the After Breast Cancer class video following surgery.  Patient was educated today on lymphedema risk reduction practices as it pertains to recommendations that will benefit the patient immediately following surgery.  She verbalized good understanding.    Physical Therapy Information for After Breast Cancer Surgery/Treatment:  Lymphedema is a swelling condition that you may be at risk for in your arm if you have lymph nodes removed from the armpit area.  After a sentinel node biopsy, the risk is approximately 5-9% and is higher after an axillary node dissection.  There is treatment available for this condition and it is not life-threatening.  Contact your physician or physical therapist with concerns. You may begin the 4 shoulder/posture exercises (see additional sheet) when permitted by your physician (typically a week after surgery).  If you have drains, you may need to wait until those are removed before beginning range of motion exercises.  A general recommendation is to not lift your arms above shoulder height until drains are removed.  These exercises should be done to your tolerance and gently.  This is not a no pain/no gain type of recovery so listen to your body and stretch into the range of motion that you can tolerate, stopping if you have  pain.  If you are having immediate reconstruction, ask your plastic surgeon about doing exercises as he or she may want you to wait. We encourage you to view the After Breast Cancer class video following surgery.  You will learn information related to lymphedema risk, prevention and treatment and additional exercises to regain mobility following surgery.   While undergoing any medical procedure or treatment, try to avoid blood pressure being taken or needle sticks from occurring on the arm on the side of cancer.   This recommendation begins after surgery and continues for the rest of your life.  This may help reduce your risk of getting lymphedema (swelling in your arm). An excellent resource for those seeking information on lymphedema is the National Lymphedema Network's web site. It can be accessed at www.lymphnet.org If you notice swelling in your hand, arm or breast at any time following surgery (even if it is many years from now), please contact your doctor or physical therapist to discuss this.  Lymphedema can be treated at any time but it is easier for you if it is treated early on.  If you feel like your shoulder motion is  not returning to normal in a reasonable amount of time, please contact your surgeon or physical therapist.  [facility] [phone]. [address], [place] [address]  [facility] CLASS After Breast Cancer Class  After Breast Cancer Class is a specially designed exercise class video to assist you in a safe recover after having breast cancer surgery.  In this video you will learn how to get back to full function whether your drains were just removed or if you had surgery a month ago. The video can be viewed on this page: https://www.boyd-meyer.org/ or on YouTube here: https://youtu.az/[email or web address].  Class Goals  Understand specific stretches to improve the flexibility of you  chest and shoulder. Learn ways to safely strengthen your upper body and improve your posture. Understand the warning signs of infection and why you may be at risk for an arm infection. Learn about Lymphedema and prevention.  ** You do not need to view this video until after surgery.  Drains should be removed to participate in the recommended exercises on the video.  Patient was instructed today in a home exercise program today for post op shoulder range of motion. These included active assist shoulder flexion in sitting, scapular retraction, wall walking with shoulder abduction, and hands behind head external rotation.  She was encouraged to do these twice a day, holding 3 seconds and repeating 5 times when permitted by her physician.  Eward Wonda Sharps, [place] 11/01/2024 3:54 PM

## 2024-11-01 NOTE — Progress Notes (Signed)
 Radiation Oncology         [phone] ________________________________  Name: Tanya [name]        MRN: [id number]  Date of Service: 11/01/2024 DOB: [birth year]  CC:Allwardt, Mardy HERO, PA-C  Aron Shoulders, MD     REFERRING PHYSICIAN: Aron Shoulders, MD  ------------------------------------------------------------------------------------------------ Attestation Please see the note from Ronita Due, PA-C from today's visit for more details of today's encounter.  I have personally performed a face to face diagnostic evaluation on this patient and devised the following assessment and plan.  The patient underwent evaluation today for her diagnosis of left-sided breast cancer.  This corresponds to a grade 3 invasive ductal carcinoma.  The tumor measured 0.8 cm and no axillary adenopathy was seen.  Based on her biologic profile, it is anticipated that she will receive adjuvant chemotherapy.  We discussed a anticipated recommendation for a course of standard hypofractionation after chemotherapy.  We discussed the rationale of treatment and all of her questions were answered.  She wishes to proceed with radiation treatment at the appropriate time.  We discussed that the port would be fine to place on the left if this is desired based on her activities as this should not interfere with our plans for radiation treatment.  If we did treat the regional lymph nodes then this could be addressed but it is not anticipated that this would be an issue at all.  Staging-clinical T1b N0 M0 invasive ductal carcinoma of the left breast    Norleen CANDIE Limes, MD, PhD ------------------------------------------------------------------------------------------------    DIAGNOSIS: The encounter diagnosis was Malignant neoplasm of upper-inner quadrant of left breast in female, estrogen receptor negative (HCC).    Cancer Staging  Malignant neoplasm of upper-inner quadrant of left breast in female, estrogen receptor  negative (HCC) Staging form: Breast, AJCC 8th Edition - Clinical stage from 10/20/2024: Stage IA (cT1b, cN0, cM0, G3, ER-, PR-, HER2+) - Signed by Lanny Callander, MD on 11/01/2024 Stage prefix: Initial diagnosis Histologic grading system: 3 grade system   Stage IA (cT1b, N0, M0) High Grade Invasive Ductal Carcinoma of the left breast, ER- /PR- /HER2+   HISTORY OF PRESENT ILLNESS: Tanya [name] is a 63 y.o. female seen in the multidisciplinary breast clinic for a new diagnosis of left breast cancer. The patient was noted to have an asymmetry on screening mammogram. She proceeded with diagnostic mammogram and ultrasound on 10/17/2024 that showed a 0.7 cm mass in the upper inner quadrant of the left breast.  No abnormalities were seen within the left axilla. Accordingly, patient underwent a biopsy on 10/20/2024 that revealed high-grade invasive ductal carcinoma lymphovascular invasion identified that was ER and PR negative and HER2 positive with a Ki-67 40%.   She is seen today to discuss treatment recommendations of her cancer.      PREVIOUS RADIATION THERAPY: No   PAST MEDICAL HISTORY:  Past Medical History:  Diagnosis Date   AAA (abdominal aortic aneurysm) ~2021   checked based on family history   Allergy    Asthma    child hood asthma  ONLY    Family history of leukemia    History of chickenpox    Hx of adenomatous polyp of colon 11/27/2015   Hypertension    under control with meds    Plantar fasciitis    Vaginal tear resulting from childbirth    occ will bleed after large bm with this        PAST SURGICAL HISTORY: Past Surgical History:  Procedure Laterality Date  CESAREAN SECTION  1997   COLONOSCOPY     VAGINAL DELIVERY     x2   WISDOM TOOTH EXTRACTION       FAMILY HISTORY:  Family History  Problem Relation Age of Onset   Aortic aneurysm Mother    Leukemia Father    Aortic aneurysm Brother    Diabetes Maternal Grandmother    Colon cancer Neg Hx    Colon polyps  Neg Hx    Esophageal cancer Neg Hx    Rectal cancer Neg Hx    Stomach cancer Neg Hx      SOCIAL HISTORY:  reports that she has never smoked. She has never used smokeless tobacco. She reports current alcohol use of about 2.0 standard drinks of alcohol per week. She reports that she does not use drugs.   ALLERGIES: Patient has no known allergies.   MEDICATIONS:  Current Outpatient Medications  Medication Sig Dispense Refill   aspirin EC 81 MG tablet Take 81 mg by mouth daily.     Calcium-Magnesium-Vitamin D (CALCIUM MAGNESIUM PO) Take by mouth.     cholecalciferol (VITAMIN D) 1000 UNITS tablet Take 1,000 Units by mouth daily.     losartan  (COZAAR ) 25 MG tablet Take 1 tablet (25 mg total) by mouth daily. 90 tablet 3   Multiple Vitamin (MULTIVITAMIN) tablet Take 1 tablet by mouth daily.     Nutritional Supplements (JUICE PLUS FIBRE PO) Take 1 tablet by mouth daily.     Omega-3 Fatty Acids (FISH OIL) 1200 MG CAPS Take 1 capsule daily by mouth.     No current facility-administered medications for this encounter.     REVIEW OF SYSTEMS: On review of systems, the patient reports that she is doing well overall. No breast specific complaints are verbalized.        PHYSICAL EXAM:  Wt Readings from Last 3 Encounters:  11/01/24 194 lb (88 kg)  10/25/24 190 lb 9.6 oz (86.5 kg)  08/02/24 188 lb 9.6 oz (85.5 kg)   Temp Readings from Last 3 Encounters:  11/01/24 97.7 F (36.5 C) (Temporal)  10/25/24 98.1 F (36.7 C) (Temporal)  08/02/24 (!) 97.2 F (36.2 C) (Temporal)   BP Readings from Last 3 Encounters:  11/01/24 122/71  10/25/24 126/82  08/02/24 120/80   Pulse Readings from Last 3 Encounters:  11/01/24 87  10/25/24 70  08/02/24 75    In general this is a well appearing female in no acute distress. She's alert and oriented x4 and appropriate throughout the examination. Cardiopulmonary assessment is negative for acute distress and she exhibits normal effort. Bilateral breast  exam is deferred.    ECOG = 0  0 - Asymptomatic (Fully active, able to carry on all predisease activities without restriction)  1 - Symptomatic but completely ambulatory (Restricted in physically strenuous activity but ambulatory and able to carry out work of a light or sedentary nature. For example, light housework, office work)  2 - Symptomatic, <50% in bed during the day (Ambulatory and capable of all self care but unable to carry out any work activities. Up and about more than 50% of waking hours)  3 - Symptomatic, >50% in bed, but not bedbound (Capable of only limited self-care, confined to bed or chair 50% or more of waking hours)  4 - Bedbound (Completely disabled. Cannot carry on any self-care. Totally confined to bed or chair)  5 - Death   Raylene MM, Creech RH, Tormey DC, et al. [phone]). Toxicity and response criteria of  the [facility]. Am. DOROTHA Bridges. Oncol. 5 (6): 649-55    LABORATORY DATA:  Lab Results  Component Value Date   WBC 6.5 11/01/2024   HGB 14.7 11/01/2024   HCT 42.2 11/01/2024   MCV 90.0 11/01/2024   PLT 246 11/01/2024   Lab Results  Component Value Date   NA 139 11/01/2024   K 4.3 11/01/2024   CL 103 11/01/2024   CO2 24 11/01/2024   Lab Results  Component Value Date   ALT 31 11/01/2024   AST 26 11/01/2024   ALKPHOS 81 11/01/2024   BILITOT 0.3 11/01/2024      RADIOGRAPHY: No results found.     IMPRESSION/PLAN: 1. Stage IA (cT1b, N0, M0) High Grade Invasive Ductal Carcinoma of the left breast, ER- /PR- /HER2+ Dr. Dewey discussed the pathology findings and reviewed the nature of breast cancer. The consensus from the breast conference includes lumpectomy and sentinel lymph node sampling, followed by chemoimmunotherapy and adjuvant radiation. Dr. Dewey recommends external beam radiotherapy to the breast following her lumpectomy to reduce risks of local recurrence followed by antiestrogen therapy. We discussed the risks,  benefits, short, and long term effects of radiotherapy, as well as the curative intent, and the patient is interested in proceeding. Dr. Dewey discussed the delivery and logistics of radiotherapy and anticipates a course of 4 weeks of radiotherapy to the left breast with deep inspiration breath hold technique. We will see her back a few weeks after surgery to discuss the simulation process and anticipate starting radiotherapy about 4-6 weeks after surgery.     In a visit lasting 60 minutes, greater than 50% of the time was spent face to face reviewing her case, as well as in preparation of, discussing, and coordinating the patient's care.  The above documentation reflects my direct findings during this shared patient visit. Please see the separate note by Dr. Dewey on this date for the remainder of the patient's plan of care.    Ronita Due, [place]    **Disclaimer: This note was dictated with voice recognition software. Similar sounding words can inadvertently be transcribed and this note may contain transcription errors which may not have been corrected upon publication of note.**

## 2024-11-02 ENCOUNTER — Other Ambulatory Visit (INDEPENDENT_AMBULATORY_CARE_PROVIDER_SITE_OTHER)

## 2024-11-02 ENCOUNTER — Encounter: Payer: Self-pay | Admitting: General Practice

## 2024-11-02 ENCOUNTER — Ambulatory Visit: Payer: Self-pay | Admitting: Physician Assistant

## 2024-11-02 DIAGNOSIS — E782 Mixed hyperlipidemia: Secondary | ICD-10-CM

## 2024-11-02 DIAGNOSIS — E1165 Type 2 diabetes mellitus with hyperglycemia: Secondary | ICD-10-CM

## 2024-11-02 LAB — LIPID PANEL
Cholesterol: 213 mg/dL — ABNORMAL HIGH (ref 28–200)
HDL: 77.7 mg/dL (ref >39.00)
LDL Cholesterol: 106 mg/dL — ABNORMAL HIGH (ref 10–99)
NonHDL: 134.96
Total CHOL/HDL Ratio: 3
Triglycerides: 145 mg/dL (ref 10.0–149.0)
VLDL: 29 mg/dL (ref 0.0–40.0)

## 2024-11-02 LAB — HEMOGLOBIN A1C: Hgb A1c MFr Bld: 6 % (ref 4.6–6.5)

## 2024-11-02 NOTE — Progress Notes (Signed)
 [facility] Multidisciplinary Clinic Spiritual Care Note  Met with Tanya [name] in Breast Multidisciplinary Clinic to introduce Support Center team/resources.  SDOH results follow below. Tanya [name] reports that she has no needs or concerns related to SDOH screening content.    SDOH Screenings   Food Insecurity: Patient Declined (10/21/2024)  Housing: Unknown (11/01/2024)   Received from [facility]  Transportation Needs: No Transportation Needs (10/21/2024)  Alcohol Screen: Low Risk (04/25/2024)  Depression (PHQ2-9): Low Risk (10/25/2024)  Financial Resource Strain: Patient Declined (10/21/2024)  Physical Activity: Sufficiently Active (10/21/2024)  Social Connections: Moderately Integrated (10/21/2024)  Stress: No Stress Concern Present (10/21/2024)  Tobacco Use: Low Risk (11/01/2024)   Chaplain and patient discussed common feelings and emotions when being diagnosed with cancer, and the importance of support during treatment.  Chaplain informed patient of the support team and support services at [facility].  Chaplain provided contact information and encouraged patient to call with any questions or concerns.  Follow up needed: Yes.  Plan to follow up by phone after surgery per patient request.   Tanya [name], MDiv, [facility] Pager [phone] Voicemail [phone]

## 2024-11-02 NOTE — Addendum Note (Signed)
 Encounter addended by: Dewey Rush, MD on: 11/02/2024 8:56 AM  Actions taken: Clinical Note Signed

## 2024-11-03 ENCOUNTER — Encounter: Payer: Self-pay | Admitting: *Deleted

## 2024-11-03 ENCOUNTER — Telehealth: Payer: Self-pay | Admitting: *Deleted

## 2024-11-03 NOTE — Telephone Encounter (Signed)
 Left message for a return phone call to follow up from [facility] 12/17 and assess navigation needs.

## 2024-11-07 ENCOUNTER — Encounter: Payer: Self-pay | Admitting: Physician Assistant

## 2024-11-14 ENCOUNTER — Telehealth: Payer: Self-pay | Admitting: Genetic Counselor

## 2024-11-14 ENCOUNTER — Ambulatory Visit: Payer: Self-pay | Admitting: Genetic Counselor

## 2024-11-14 ENCOUNTER — Encounter: Payer: Self-pay | Admitting: Genetic Counselor

## 2024-11-14 DIAGNOSIS — Z1379 Encounter for other screening for genetic and chromosomal anomalies: Secondary | ICD-10-CM

## 2024-11-14 NOTE — Progress Notes (Signed)
 HPI:  Ms. Dade was previously seen in the [facility] due to a personal and family history of breast cancer and concerns regarding a hereditary predisposition to cancer. Please refer to our prior cancer genetics clinic note for more information regarding our discussion, assessment and recommendations, at the time. Ms. Louissaint recent genetic test results were disclosed to her, as were recommendations warranted by these results. These results and recommendations are discussed in more detail below.  CANCER HISTORY:  Oncology History  Malignant neoplasm of upper-inner quadrant of left breast in female, estrogen receptor negative (HCC)  10/20/2024 Cancer Staging   Staging form: Breast, AJCC 8th Edition - Clinical stage from 10/20/2024: Stage IA (cT1b, cN0, cM0, G3, ER-, PR-, HER2+) - Signed by Lanny Callander, MD on 11/01/2024 Stage prefix: Initial diagnosis Histologic grading system: 3 grade system   10/30/2024 Initial Diagnosis   Malignant neoplasm of upper-inner quadrant of left breast in female, estrogen receptor negative (HCC)   11/08/2024 Genetic Testing   Negative genetic testing The results date is November 08, 2024  The CancerNext-Expanded gene panel offered by [employer] and includes sequencing, rearrangement, and RNA analysis for the following 77 genes: AIP, ALK, APC, ATM, BAP1, BARD1, BMPR1A, BRCA1, BRCA2, BRIP1, CDC73, CDH1, CDK4, CDKN1B, CDKN2A, CEBPA, CHEK2, CTNNA1, DDX41, DICER1, ETV6, FH, FLCN, GATA2, LZTR1, MAX, MBD4, MEN1, MET, MLH1, MSH2, MSH3, MSH6, MUTYH, NF1, NF2, NTHL1, PALB2, PHOX2B, PMS2, POT1, PRKAR1A, PTCH1, PTEN, RAD51C, RAD51D, RB1, RET, RPS20, RUNX1, SDHA, SDHAF2, SDHB, SDHC, SDHD, SMAD4, SMARCA4, SMARCB1, SMARCE1, STK11, SUFU, TMEM127, TP53, TSC1, TSC2, VHL, and WT1 (sequencing and deletion/duplication); AXIN2, CTNNA1, DDX41, EGFR, HOXB13, KIT, MBD4, MITF, MSH3, PDGFRA, POLD1 and POLE (sequencing only); EPCAM and GREM1 (deletion/duplication  only). RNA data is routinely analyzed for use in variant interpretation for all genes.      FAMILY HISTORY:  We obtained a detailed, 4-generation family history.  Significant diagnoses are listed below: Family History  Problem Relation Age of Onset   Aortic aneurysm Mother    Leukemia Father    Aortic aneurysm Brother    Diabetes Maternal Grandmother    Colon cancer Neg Hx    Colon polyps Neg Hx    Esophageal cancer Neg Hx    Rectal cancer Neg Hx    Stomach cancer Neg Hx        Significant family history of cancer reported includes: Father with leukemia   Ms. Settlemyre is unaware of previous family history of genetic testing for hereditary cancer risks. There is no reported Ashkenazi Jewish ancestry. There is no known consanguinity  GENETIC TEST RESULTS: Genetic testing reported out on November 08, 2024 through the CancerNext-Expanded+RNAinsight cancer panel found no pathogenic mutations. The CancerNext-Expanded gene panel offered by [facility] and includes sequencing, rearrangement, and RNA analysis for the following 77 genes: AIP, ALK, APC, ATM, BAP1, BARD1, BMPR1A, BRCA1, BRCA2, BRIP1, CDC73, CDH1, CDK4, CDKN1B, CDKN2A, CEBPA, CHEK2, CTNNA1, DDX41, DICER1, ETV6, FH, FLCN, GATA2, LZTR1, MAX, MBD4, MEN1, MET, MLH1, MSH2, MSH3, MSH6, MUTYH, NF1, NF2, NTHL1, PALB2, PHOX2B, PMS2, POT1, PRKAR1A, PTCH1, PTEN, RAD51C, RAD51D, RB1, RET, RPS20, RUNX1, SDHA, SDHAF2, SDHB, SDHC, SDHD, SMAD4, SMARCA4, SMARCB1, SMARCE1, STK11, SUFU, TMEM127, TP53, TSC1, TSC2, VHL, and WT1 (sequencing and deletion/duplication); AXIN2, CTNNA1, DDX41, EGFR, HOXB13, KIT, MBD4, MITF, MSH3, PDGFRA, POLD1 and POLE (sequencing only); EPCAM and GREM1 (deletion/duplication only). RNA data is routinely analyzed for use in variant interpretation for all genes. The test report has been scanned into EPIC and is located under the Molecular  Pathology section of the Results Review tab.  A portion of the result report is included  below for reference.     We discussed with Ms. Waters that because current genetic testing is not perfect, it is possible there may be a gene mutation in one of these genes that current testing cannot detect, but that chance is small.  We also discussed, that there could be another gene that has not yet been discovered, or that we have not yet tested, that is responsible for the cancer diagnoses in the family. It is also possible there is a hereditary cause for the cancer in the family that Ms. Peavler did not inherit and therefore was not identified in her testing.  Therefore, it is important to remain in touch with cancer genetics in the future so that we can continue to offer Ms. Boutelle the most up to date genetic testing.   ADDITIONAL GENETIC TESTING: We discussed with Ms. Life that her genetic testing was fairly extensive.  If there are genes identified to increase cancer risk that can be analyzed in the future, we would be happy to discuss and coordinate this testing at that time.    CANCER SCREENING RECOMMENDATIONS: Ms. Stagner test result is considered negative (normal).  This means that we have not identified a hereditary cause for her personal and family history of cancer at this time. Most cancers happen by chance and this negative test suggests that her personal and family history of cancer may fall into this category.    Possible reasons for Ms. Lerette's negative genetic test include:  1. There may be a gene mutation in one of these genes that current testing methods cannot detect but that chance is small.  2. There could be another gene that has not yet been discovered, or that we have not yet tested, that is responsible for the cancer diagnoses in the family.  3.  There may be no hereditary risk for cancer in the family. The cancers in Ms. Mongillo and/or her family may be sporadic/familial or due to other genetic and environmental factors. 4. It is also  possible there is a hereditary cause for the cancer in the family that Ms. Ziemann did not inherit.  Therefore, it is recommended she continue to follow the cancer management and screening guidelines provided by her oncology and primary healthcare provider. An individual's cancer risk and medical management are not determined by genetic test results alone. Overall cancer risk assessment incorporates additional factors, including personal medical history, family history, and any available genetic information that may result in a personalized plan for cancer prevention and surveillance  RECOMMENDATIONS FOR FAMILY MEMBERS:   Since she did not inherit a identifiable mutation in a cancer predisposition gene included on this panel, her children could not have inherited a known mutation from her in one of these genes. Individuals in this family might be at some increased risk of developing cancer, over the general population risk, simply due to the family history of cancer.  We recommended women in this family have a yearly mammogram beginning at age 57, or 32 years younger than the earliest onset of cancer, an annual clinical breast exam, and perform monthly breast self-exams. Women in this family should also have a gynecological exam as recommended by their primary provider. All family members should be referred for colonoscopy starting at age 60, or 23 years younger than the earliest onset of cancer.  FOLLOW-UP: Lastly, we discussed with Ms. Weight that cancer genetics  is a rapidly advancing field and it is possible that new genetic tests will be appropriate for her and/or her family members in the future. We encouraged her to remain in contact with cancer genetics on an annual basis so we can update her personal and family histories and let her know of advances in cancer genetics that may benefit this family.   Our contact number was provided. Ms. Runions questions were answered to her  satisfaction, and she knows she is welcome to call us  at anytime with additional questions or concerns.   Darice Monte, MS, [facility] Licensed, Certified Genetic Counselor [email or web address]

## 2024-11-14 NOTE — Telephone Encounter (Signed)
 I contacted  Tanya [name] to discuss her genetic testing results. No pathogenic variants were identified in the 77 genes analyzed. Discussed that we do not know why she has breast cancer or why there is cancer in the family. It could be due to a different gene that we are not testing, or maybe our current technology may not be able to pick something up.  It will be important for her to keep in contact with genetics to keep up with whether additional testing may be needed.Detailed clinic note to follow.   The test report will be scanned into EPIC and will be located under the Molecular Pathology section of the Results Review tab.  A portion of the result report is included below for reference.

## 2024-11-15 ENCOUNTER — Ambulatory Visit (HOSPITAL_COMMUNITY)

## 2024-11-15 ENCOUNTER — Other Ambulatory Visit: Payer: Self-pay | Admitting: Physician Assistant

## 2024-11-15 DIAGNOSIS — I10 Essential (primary) hypertension: Secondary | ICD-10-CM

## 2024-11-17 ENCOUNTER — Other Ambulatory Visit: Payer: Self-pay

## 2024-11-17 NOTE — Progress Notes (Signed)
 Patient called in stating she wanted to make her care team aware she is having a root canal on Tuesday 01/06 and her lumpectomy on 01/08. Patient wanted to make sure there would be no issue with having both. I spoke with Powell Lessen NP and she stated that she should have no issues as long as she is not on any blood thinners. I checked the med list to insure no blood thinner. Patient voiced full understanding and had no further questions at this time.

## 2024-11-20 ENCOUNTER — Encounter (HOSPITAL_BASED_OUTPATIENT_CLINIC_OR_DEPARTMENT_OTHER): Payer: Self-pay | Admitting: General Surgery

## 2024-11-20 DIAGNOSIS — Z171 Estrogen receptor negative status [ER-]: Secondary | ICD-10-CM

## 2024-11-20 NOTE — H&P (Signed)
 " REFERRING PHYSICIAN:  Ronita Mouse, MD   PROVIDER:  JINA CLAIR NEPHEW, MD   Care Team: Patient Care Team: Allwardt, Mardy HERO as PCP - General Nephew Jina Clair, MD as Consulting Provider (Surgical Oncology) Lanny Callander, MD as Referring Physician (Hematology and Oncology) Dewey Norleen Hamilton, MD as Referring Physician (Radiation Oncology)    MRN: [id number] DOB: June 26, 1961 DATE OF ENCOUNTER: 11/01/2024   Subjective    Chief Complaint: Breast Cancer       History of Present Illness: Tanya [name] is a 64 y.o. female who is seen today as an office consultation at the request of Dr. Lanny for evaluation of Breast Cancer .     Patient presents with a new diagnosis of breast cancer December 2025.  The patient had a screening detected left breast mass.  Diagnostic imaging was performed.  This mass was 7 to 8 mm in the upper inner quadrant.  Core needle biopsy was performed.  This showed a grade 3 invasive ductal carcinoma that was ER and PR negative, HER2 positive, and Ki-67 40%.   History of Present Illness Tanya [name] is a 64 year old female with a small breast tumor who presents for surgical consultation regarding lumpectomy and sentinel lymph node biopsy. She is accompanied by her husband. She has a small breast tumor identified through imaging and biopsy. The tumor is not palpable, and imaging showed no enlarged or abnormally shaped lymph nodes.   She underwent a biopsy while awake, which was not particularly distressing for her.    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 She plans to continue these activities post-recovery, with modifications as needed. She is currently not on any antihormonal treatment and has not taken any such medications in the past.  She has plans to spend 2 weeks over the holidays in [place]  with her husband and children skiing.   She enjoys playing tennis and pickleball also       Family cancer history -father had  leukemia Menarche -16 Menopause 61 Parity -3, first age 39   Work -retired   Diagnostic mammogram:10/17/24 [facility]    Diagnostic ultrasound 10/17/24 [facility]    Pathology core needle biopsy: 10/20/24    Receptors:      Review of Systems: A complete review of systems was obtained from the patient.  I have reviewed this information and discussed as appropriate with the patient.  See HPI as well for other ROS. ROS -positive for occasional hot flashes       Medical History: Past Medical History      Past Medical History:  Diagnosis Date   Asthma, unspecified asthma severity, unspecified whether complicated, unspecified whether persistent (HHS-HCC)     Hypertension          Problem List     Patient Active Problem List  Diagnosis   Malignant neoplasm of upper-inner quadrant of left breast in female, estrogen receptor negative (CMS/HHS-HCC)        Past Surgical History       Past Surgical History:  Procedure Laterality Date   CESAREAN SECTION            Allergies  No Known Allergies     Medications Ordered Prior to Encounter        Current Outpatient Medications on File Prior to Visit  Medication Sig Dispense Refill   aspirin 81 MG EC tablet Take 81 mg by mouth once daily       cholecalciferol 1000 unit  tablet Take by mouth       losartan  (COZAAR ) 25 MG tablet Take 25 mg by mouth once daily       multivitamin tablet Take 1 tablet by mouth once daily       omega 3-dha-epa-fish oil (FISH OIL) 1,200 (144-216) mg Cap Take by mouth        No current facility-administered medications on file prior to visit.        Family History  History reviewed. No pertinent family history.      Tobacco Use History  Social History       Tobacco Use  Smoking Status Never  Smokeless Tobacco Never        Social History  Social History        Socioeconomic History   Marital status: Married  Tobacco Use   Smoking status: Never   Smokeless tobacco: Never  Substance  and Sexual Activity   Alcohol use: Never   Drug use: Never    Social Drivers of Acupuncturist Strain: Patient Declined (10/21/2024)    Received from [facility]    Overall Financial Resource Strain (CARDIA)     How hard is it for you to pay for the very basics like food, housing, medical care, and heating?: Patient declined  Food Insecurity: Patient Declined (10/21/2024)    Received from [facility]    Hunger Vital Sign     Within the past 12 months, you worried that your food would run out before you got the money to buy more.: Patient declined     Within the past 12 months, the food you bought just didn't last and you didn't have money to get more.: Patient declined  Transportation Needs: No Transportation Needs (10/21/2024)    Received from [facility]     In the past 12 months, has lack of transportation kept you from medical appointments or from getting medications?: No     In the past 12 months, has lack of transportation kept you from meetings, work, or from getting things needed for daily living?: No  Physical Activity: Sufficiently Active (10/21/2024)    Received from [facility]    Exercise Vital Sign     On average, how many days per week do you engage in moderate to strenuous exercise (like a brisk walk)?: 4 days     On average, how many minutes do you engage in exercise at this level?: 90 min  Stress: No Stress Concern Present (10/21/2024)    Received from [facility] - Occupational Stress Questionnaire     Do you feel stress - tense, restless, nervous, or anxious, or unable to sleep at night because your mind is troubled all the time - these days?: Only a little  Social Connections: Moderately Integrated (10/21/2024)    Received from [facility]    Social Connection and Isolation Panel     In a typical week, how many times do you talk on the phone with family, friends, or neighbors?:  Three times a week     How often do you get together with friends or relatives?: Three times a week     How often do you attend church or religious services?: Patient declined     Do you belong to any clubs or organizations such as church groups, unions, fraternal or athletic groups, or school groups?: Yes  How often do you attend meetings of the clubs or organizations you belong to?: 1 to 4 times per year     Are you married, widowed, divorced, separated, never married, or living with a partner?: Married  Housing Stability: Unknown (11/01/2024)    Housing Stability Vital Sign     Homeless in the Last Year: No        Objective:         Vitals:    11/01/24 1613  BP: 122/71  Pulse: 87  Resp: 18  Temp: 36.5 C (97.7 F)  Weight: 88 kg (194 lb)  Height: 160 cm (5' 3)    Body mass index is 34.37 kg/m.   Gen:  No acute distress.  Well nourished and well groomed.   Neurological: Alert and oriented to person, place, and time. Coordination normal.  Head: Normocephalic and atraumatic.  Eyes: Conjunctivae are normal. Pupils are equal, round, and reactive to light. No scleral icterus.  Neck: Normal range of motion. Neck supple. No tracheal deviation or thyromegaly present.  Cardiovascular: Normal rate, regular rhythm, normal heart sounds and intact distal pulses.  Exam reveals no gallop and no friction rub.  No murmur heard. Breast: -Relatively symmetric.  Ptotic.  No palpable masses either breast.  No nipple retraction or nipple discharge.  No contour changes.  No skin dimpling.  No lymphadenopathy. Respiratory: Effort normal.  No respiratory distress. No chest wall tenderness. Breath sounds normal.  No wheezes, rales or rhonchi.  GI: Soft. Bowel sounds are normal. The abdomen is soft and nontender.  There is no rebound and no guarding.  Musculoskeletal: Normal range of motion. Extremities are nontender.  Lymphadenopathy: No cervical, preauricular, postauricular or axillary adenopathy  is present Skin: Skin is warm and dry. No rash noted. No diaphoresis. No erythema. No pallor. No clubbing, cyanosis, or edema.   Psychiatric: Normal mood and affect. Behavior is normal. Judgment and thought content normal.      Labs Labs performed today CBC normal CMET normal other than glucose of 159   Assessment and Plan:        ICD-10-CM    1. Malignant neoplasm of upper-inner quadrant of left breast in female, estrogen receptor negative (CMS/HHS-HCC)  C50.212      Z17.1         Assessment & Plan Right breast cancer, HER2 positive Small HER2 positive tumor in right breast, no lymph node involvement.  Breast conservation recommended.. Sentinel lymph node biopsy planned. Port-a-cath placement for chemotherapy. Low risk of additional surgery. Discussed risks and shared decision-making for lumpectomy. - Schedule lumpectomy with sentinel lymph node biopsy and port-a-cath placement. - Place magnetic seed for tumor localization pre-surgery. - Administer anesthesia and perform block for post-op pain management. - Use Magtrace dye for sentinel lymph node identification. - Perform 3D specimen mammogram to assess margins. - Fit for post-op bra for compression and support. - Advise no heavy lifting for 1-2 weeks post-surgery. - Advise no shower until second day post-surgery. - Advise no swimming or bathtub use for 2 weeks post-surgery. - Plan chemotherapy regimen post-surgery, potentially including Taxol and Trastuzumab.   The surgical procedure was described to the patient.  I discussed the incision type and location and that we would need radiology involved pre op to place a seed 1-2 days pre op.      We discussed the risks bleeding, infection, damage to other structures, need for further procedures/surgeries.  We discussed the risk of seroma.  The patient was advised if  the breast has cancer, we may need to go back to surgery for additional tissue to obtain negative margins or for a lymph  node biopsy. The patient was advised that these are the most common complications, but that others can occur as well. I discussed the risk of alteration in breast contour or size.  I discussed risk of chronic pain.  There are rare instances of heart/lung issues post op as well as blood clots.      They were advised against taking aspirin or other anti-inflammatory agents/blood thinners the week before surgery.     The risks and benefits of the procedure were described to the patient and she wishes to proceed.       "

## 2024-11-20 NOTE — Telephone Encounter (Signed)
 [employer] 2021-05 - Specimen Collection Study to Evaluate Biomarkers in Subjects with Cancer    Ms. Neubert  was contacted by phone at 12:45 PM on 11/20/2024 regarding the participation of the above study. Pt. was at the airport and preparing for takeoff at the time of the call. Pt agreed to be contacted again tomorrow afternoon. Pt was thanked for her time. Pt knows to contact research team any time with research-related questions/concerns.  Abelardo Jock Clinical Research Coordinator [phone] 11/20/2024 12:45 PM

## 2024-11-21 ENCOUNTER — Telehealth: Payer: Self-pay

## 2024-11-21 ENCOUNTER — Ambulatory Visit: Attending: Internal Medicine

## 2024-11-21 DIAGNOSIS — Z171 Estrogen receptor negative status [ER-]: Secondary | ICD-10-CM

## 2024-11-21 LAB — ECHOCARDIOGRAM COMPLETE
AR max vel: 2.68 cm2
AV Peak grad: 4.1 mmHg
AV Vena cont: 0.5 cm
Ao pk vel: 1.01 m/s
Area-P 1/2: 3.87 cm2
Calc EF: 65 %
Est EF: 55
P 1/2 time: 634 ms
S' Lateral: 2.9 cm
Single Plane A2C EF: 69 %
Single Plane A4C EF: 60.8 %

## 2024-11-21 MED ORDER — PERFLUTREN LIPID MICROSPHERE
1.0000 mL | INTRAVENOUS | Status: AC | PRN
  Administered 2024-11-21: 9 mL via INTRAVENOUS

## 2024-11-21 NOTE — Telephone Encounter (Signed)
 [employer] 2021-05 - Specimen Collection Study to Evaluate Biomarkers in Subjects with Cancer    This CRC attempted to contact Ms. Barnhart  by phone regarding the the above study. Patient was not available. A voice message was left with the call back number.  Abelardo Jock Clinical Research Coordinator [phone] 11/21/2024 1:16 PM

## 2024-11-22 ENCOUNTER — Ambulatory Visit: Payer: Self-pay | Admitting: Nurse Practitioner

## 2024-11-22 ENCOUNTER — Encounter: Payer: Self-pay | Admitting: Physician Assistant

## 2024-11-22 NOTE — Telephone Encounter (Signed)
 Please see pt msg and advise any recommendations

## 2024-11-23 ENCOUNTER — Encounter (HOSPITAL_BASED_OUTPATIENT_CLINIC_OR_DEPARTMENT_OTHER): Payer: Self-pay | Admitting: General Surgery

## 2024-11-23 ENCOUNTER — Other Ambulatory Visit: Payer: Self-pay

## 2024-11-23 ENCOUNTER — Ambulatory Visit (HOSPITAL_BASED_OUTPATIENT_CLINIC_OR_DEPARTMENT_OTHER): Payer: Self-pay | Admitting: Anesthesiology

## 2024-11-23 ENCOUNTER — Ambulatory Visit (HOSPITAL_COMMUNITY)

## 2024-11-23 ENCOUNTER — Ambulatory Visit (HOSPITAL_BASED_OUTPATIENT_CLINIC_OR_DEPARTMENT_OTHER)
Admission: RE | Admit: 2024-11-23 | Discharge: 2024-11-23 | Disposition: A | Discharge status: Home / Self Care | Attending: General Surgery | Admitting: General Surgery

## 2024-11-23 ENCOUNTER — Encounter (HOSPITAL_BASED_OUTPATIENT_CLINIC_OR_DEPARTMENT_OTHER)
Admission: RE | Disposition: A | Discharge status: Home / Self Care | Payer: Self-pay | Source: Home / Self Care | Attending: General Surgery

## 2024-11-23 DIAGNOSIS — J45909 Unspecified asthma, uncomplicated: Secondary | ICD-10-CM | POA: Insufficient documentation

## 2024-11-23 DIAGNOSIS — E119 Type 2 diabetes mellitus without complications: Secondary | ICD-10-CM | POA: Insufficient documentation

## 2024-11-23 DIAGNOSIS — Z1722 Progesterone receptor negative status: Secondary | ICD-10-CM | POA: Diagnosis not present

## 2024-11-23 DIAGNOSIS — Z1732 Human epidermal growth factor receptor 2 negative status: Secondary | ICD-10-CM | POA: Diagnosis not present

## 2024-11-23 DIAGNOSIS — Z79899 Other long term (current) drug therapy: Secondary | ICD-10-CM | POA: Insufficient documentation

## 2024-11-23 DIAGNOSIS — C50912 Malignant neoplasm of unspecified site of left female breast: Secondary | ICD-10-CM | POA: Diagnosis not present

## 2024-11-23 DIAGNOSIS — E1165 Type 2 diabetes mellitus with hyperglycemia: Secondary | ICD-10-CM

## 2024-11-23 DIAGNOSIS — C50212 Malignant neoplasm of upper-inner quadrant of left female breast: Secondary | ICD-10-CM | POA: Diagnosis present

## 2024-11-23 DIAGNOSIS — Z171 Estrogen receptor negative status [ER-]: Secondary | ICD-10-CM | POA: Insufficient documentation

## 2024-11-23 DIAGNOSIS — I10 Essential (primary) hypertension: Secondary | ICD-10-CM | POA: Diagnosis not present

## 2024-11-23 HISTORY — PX: SENTINEL NODE BIOPSY: SHX6608

## 2024-11-23 HISTORY — PX: PORTACATH PLACEMENT: SHX2246

## 2024-11-23 SURGERY — LUMPECTOMY WITH MAGNETIC MARKER LOCALIZATION
Anesthesia: General | Site: Chest | Laterality: Left

## 2024-11-23 MED ORDER — PHENYLEPHRINE HCL-NACL 20-0.9 MG/250ML-% IV SOLN
INTRAVENOUS | Status: DC | PRN
  Administered 2024-11-23: 50 ug/min via INTRAVENOUS

## 2024-11-23 MED ORDER — CEFAZOLIN SODIUM-DEXTROSE 2-3 GM-%(50ML) IV SOLR
INTRAVENOUS | Status: DC | PRN
  Administered 2024-11-23: 2 g via INTRAVENOUS

## 2024-11-23 MED ORDER — SCOPOLAMINE 1 MG/3DAYS TD PT72
MEDICATED_PATCH | TRANSDERMAL | Status: AC
  Filled 2024-11-23: qty 1

## 2024-11-23 MED ORDER — MIDAZOLAM HCL 2 MG/2ML IJ SOLN
INTRAMUSCULAR | Status: AC
  Filled 2024-11-23: qty 2

## 2024-11-23 MED ORDER — DEXAMETHASONE SOD PHOSPHATE PF 10 MG/ML IJ SOLN
INTRAMUSCULAR | Status: DC | PRN
  Administered 2024-11-23: 5 mg via INTRAVENOUS

## 2024-11-23 MED ORDER — LACTATED RINGERS IV SOLN: INTRAVENOUS | Status: DC

## 2024-11-23 MED ORDER — ONDANSETRON HCL 4 MG/2ML IJ SOLN
INTRAMUSCULAR | Status: AC
  Filled 2024-11-23: qty 2

## 2024-11-23 MED ORDER — CHLORHEXIDINE GLUCONATE CLOTH 2 % EX PADS: 6.0000 | MEDICATED_PAD | Freq: Once | CUTANEOUS | Status: DC

## 2024-11-23 MED ORDER — ACETAMINOPHEN 500 MG PO TABS
ORAL_TABLET | ORAL | Status: AC
  Filled 2024-11-23: qty 2

## 2024-11-23 MED ORDER — MAGTRACE LYMPHATIC TRACER
INTRAMUSCULAR | Status: DC | PRN
  Administered 2024-11-23: 2 mL via INTRAMUSCULAR

## 2024-11-23 MED ORDER — LACTATED RINGERS IV SOLN: INTRAVENOUS | Status: DC | PRN

## 2024-11-23 MED ORDER — CEFAZOLIN SODIUM-DEXTROSE 2-4 GM/100ML-% IV SOLN: 2.0000 g | INTRAVENOUS | Status: DC

## 2024-11-23 MED ORDER — HEPARIN SOD (PORK) LOCK FLUSH 100 UNIT/ML IV SOLN
INTRAVENOUS | Status: DC | PRN
  Administered 2024-11-23: 500 [IU] via INTRAVENOUS

## 2024-11-23 MED ORDER — LIDOCAINE HCL (CARDIAC) PF 100 MG/5ML IV SOSY
PREFILLED_SYRINGE | INTRAVENOUS | Status: DC | PRN
  Administered 2024-11-23: 60 mg via INTRAVENOUS

## 2024-11-23 MED ORDER — CEFAZOLIN SODIUM-DEXTROSE 2-4 GM/100ML-% IV SOLN
INTRAVENOUS | Status: AC
  Filled 2024-11-23: qty 100

## 2024-11-23 MED ORDER — FENTANYL CITRATE (PF) 100 MCG/2ML IJ SOLN
50.0000 ug | Freq: Once | INTRAMUSCULAR | Status: AC
  Administered 2024-11-23: 50 ug via INTRAVENOUS

## 2024-11-23 MED ORDER — PROPOFOL 10 MG/ML IV BOLUS
INTRAVENOUS | Status: AC
  Filled 2024-11-23: qty 20

## 2024-11-23 MED ORDER — SCOPOLAMINE 1 MG/3DAYS TD PT72
1.0000 | MEDICATED_PATCH | TRANSDERMAL | Status: DC
  Administered 2024-11-23: 1 mg via TRANSDERMAL

## 2024-11-23 MED ORDER — MIDAZOLAM HCL (PF) 2 MG/2ML IJ SOLN
2.0000 mg | Freq: Once | INTRAMUSCULAR | Status: AC
  Administered 2024-11-23: 2 mg via INTRAVENOUS

## 2024-11-23 MED ORDER — ACETAMINOPHEN 500 MG PO TABS
1000.0000 mg | ORAL_TABLET | ORAL | Status: AC
  Administered 2024-11-23: 1000 mg via ORAL

## 2024-11-23 MED ORDER — HEPARIN (PORCINE) IN NACL 1000-0.9 UT/500ML-% IV SOLN
INTRAVENOUS | Status: AC
  Filled 2024-11-23: qty 500

## 2024-11-23 MED ORDER — LIDOCAINE-EPINEPHRINE (PF) 1 %-1:200000 IJ SOLN
INTRAMUSCULAR | Status: AC
  Filled 2024-11-23: qty 30

## 2024-11-23 MED ORDER — PHENYLEPHRINE HCL (PRESSORS) 10 MG/ML IV SOLN
INTRAVENOUS | Status: DC | PRN
  Administered 2024-11-23 (×6): 160 ug via INTRAVENOUS

## 2024-11-23 MED ORDER — ONDANSETRON HCL 4 MG/2ML IJ SOLN: 4.0000 mg | Freq: Once | INTRAMUSCULAR | Status: DC | PRN

## 2024-11-23 MED ORDER — LIDOCAINE-EPINEPHRINE (PF) 1 %-1:200000 IJ SOLN
INTRAMUSCULAR | Status: DC | PRN
  Administered 2024-11-23: 10 mL

## 2024-11-23 MED ORDER — FENTANYL CITRATE (PF) 100 MCG/2ML IJ SOLN
INTRAMUSCULAR | Status: AC
  Filled 2024-11-23: qty 2

## 2024-11-23 MED ORDER — FENTANYL CITRATE (PF) 100 MCG/2ML IJ SOLN: 25.0000 ug | INTRAMUSCULAR | Status: DC | PRN

## 2024-11-23 MED ORDER — PROPOFOL 10 MG/ML IV BOLUS
INTRAVENOUS | Status: DC | PRN
  Administered 2024-11-23: 170 mg via INTRAVENOUS

## 2024-11-23 MED ORDER — LIDOCAINE 2% (20 MG/ML) 5 ML SYRINGE
INTRAMUSCULAR | Status: AC
  Filled 2024-11-23: qty 5

## 2024-11-23 MED ORDER — OXYCODONE HCL 5 MG PO TABS: 5.0000 mg | ORAL_TABLET | Freq: Four times a day (QID) | ORAL | 0 refills | Status: DC | PRN

## 2024-11-23 MED ORDER — BUPIVACAINE HCL (PF) 0.25 % IJ SOLN
INTRAMUSCULAR | Status: AC
  Filled 2024-11-23: qty 30

## 2024-11-23 MED ORDER — ONDANSETRON HCL 4 MG/2ML IJ SOLN
INTRAMUSCULAR | Status: DC | PRN
  Administered 2024-11-23: 4 mg via INTRAVENOUS

## 2024-11-23 MED ORDER — HEPARIN (PORCINE) IN NACL 2-0.9 UNITS/ML
INTRAMUSCULAR | Status: AC | PRN
  Administered 2024-11-23: 500 mL via INTRAVENOUS

## 2024-11-23 MED ORDER — FENTANYL CITRATE (PF) 100 MCG/2ML IJ SOLN
INTRAMUSCULAR | Status: DC | PRN
  Administered 2024-11-23 (×5): 25 ug via INTRAVENOUS
  Administered 2024-11-23: 50 ug via INTRAVENOUS
  Administered 2024-11-23: 25 ug via INTRAVENOUS

## 2024-11-23 MED ORDER — PHENYLEPHRINE 80 MCG/ML (10ML) SYRINGE FOR IV PUSH (FOR BLOOD PRESSURE SUPPORT)
PREFILLED_SYRINGE | INTRAVENOUS | Status: AC
  Filled 2024-11-23: qty 50

## 2024-11-23 MED ORDER — AMISULPRIDE (ANTIEMETIC) 5 MG/2ML IV SOLN: 10.0000 mg | Freq: Once | INTRAVENOUS | Status: DC | PRN

## 2024-11-23 MED ORDER — PHENYLEPHRINE 80 MCG/ML (10ML) SYRINGE FOR IV PUSH (FOR BLOOD PRESSURE SUPPORT)
PREFILLED_SYRINGE | INTRAVENOUS | Status: AC
  Filled 2024-11-23: qty 10

## 2024-11-23 SURGICAL SUPPLY — 49 items
BAG DECANTER FOR FLEXI CONT (MISCELLANEOUS) ×3 IMPLANT
BLADE HEX COATED 2.75 (ELECTRODE) ×3 IMPLANT
BLADE SURG 10 STRL SS (BLADE) ×3 IMPLANT
BLADE SURG 11 STRL SS (BLADE) ×3 IMPLANT
BNDG COHESIVE 4X5 TAN NS LF (GAUZE/BANDAGES/DRESSINGS) IMPLANT
CHLORAPREP W/TINT 26 (MISCELLANEOUS) ×3 IMPLANT
CLIP TI LARGE 6 (CLIP) IMPLANT
CLIP TI MEDIUM 6 (CLIP) IMPLANT
COVER BACK TABLE 60X90IN (DRAPES) ×3 IMPLANT
COVER MAYO STAND STRL (DRAPES) ×3 IMPLANT
COVER PROBE W GEL 5X96 (DRAPES) IMPLANT
DERMABOND ADVANCED .7 DNX12 (GAUZE/BANDAGES/DRESSINGS) ×3 IMPLANT
DRAPE C-ARM 42X72 X-RAY (DRAPES) ×3 IMPLANT
DRAPE LAPAROTOMY TRNSV 102X78 (DRAPES) ×3 IMPLANT
DRAPE UTILITY XL STRL (DRAPES) ×3 IMPLANT
DRSG TEGADERM 4X4.75 (GAUZE/BANDAGES/DRESSINGS) IMPLANT
ELECTRODE REM PT RTRN 9FT ADLT (ELECTROSURGICAL) ×3 IMPLANT
GAUZE 4X4 16PLY ~~LOC~~+RFID DBL (SPONGE) ×3 IMPLANT
GAUZE PAD ABD 8X10 STRL (GAUZE/BANDAGES/DRESSINGS) IMPLANT
GAUZE SPONGE 4X4 12PLY STRL (GAUZE/BANDAGES/DRESSINGS) IMPLANT
GAUZE SPONGE 4X4 12PLY STRL LF (GAUZE/BANDAGES/DRESSINGS) IMPLANT
GLOVE BIO SURGEON STRL SZ 6 (GLOVE) ×3 IMPLANT
GLOVE BIOGEL PI IND STRL 6.5 (GLOVE) ×3 IMPLANT
GLOVE BIOGEL PI IND STRL 7.0 (GLOVE) IMPLANT
GLOVE ECLIPSE 6.5 STRL STRAW (GLOVE) IMPLANT
GOWN STRL REUS W/ TWL LRG LVL3 (GOWN DISPOSABLE) ×3 IMPLANT
GOWN STRL REUS W/ TWL XL LVL3 (GOWN DISPOSABLE) ×3 IMPLANT
IV CONNECTOR ONE LINK NDLESS (IV SETS) IMPLANT
KIT CVR 48X5XPRB PLUP LF (MISCELLANEOUS) ×3 IMPLANT
KIT MARKER MARGIN INK (KITS) IMPLANT
KIT PORT POWER 8FR ISP CVUE (Port) IMPLANT
NEEDLE HYPO 25X1 1.5 SAFETY (NEEDLE) ×3 IMPLANT
PACK BASIN DAY SURGERY FS (CUSTOM PROCEDURE TRAY) ×3 IMPLANT
PACK UNIVERSAL I (CUSTOM PROCEDURE TRAY) IMPLANT
PENCIL SMOKE EVACUATOR (MISCELLANEOUS) ×3 IMPLANT
SLEEVE SCD COMPRESS KNEE MED (STOCKING) ×3 IMPLANT
SPIKE FLUID TRANSFER (MISCELLANEOUS) IMPLANT
STOCKINETTE IMPERVIOUS LG (DRAPES) IMPLANT
STRIP CLOSURE SKIN 1/2X4 (GAUZE/BANDAGES/DRESSINGS) IMPLANT
SUT MNCRL AB 4-0 PS2 18 (SUTURE) ×3 IMPLANT
SUT PROLENE 2 0 SH DA (SUTURE) ×6 IMPLANT
SUT VIC AB 2-0 SH 27XBRD (SUTURE) IMPLANT
SUT VICRYL 3-0 CR8 SH (SUTURE) ×3 IMPLANT
SYR 10ML LL (SYRINGE) ×3 IMPLANT
SYR 5ML LUER SLIP (SYRINGE) ×3 IMPLANT
SYR CONTROL 10ML LL (SYRINGE) ×3 IMPLANT
TOWEL GREEN STERILE FF (TOWEL DISPOSABLE) ×3 IMPLANT
TUBE CONNECTING 20X1/4 (TUBING) IMPLANT
YANKAUER SUCT BULB TIP NO VENT (SUCTIONS) IMPLANT

## 2024-11-23 NOTE — Discharge Instructions (Addendum)
 [facility] [facility] Office Phone Number [phone]  BREAST BIOPSY/ PARTIAL MASTECTOMY: POST OP INSTRUCTIONS  Always review your discharge instruction sheet given to you by the facility where your surgery was performed.  IF YOU HAVE DISABILITY OR FAMILY LEAVE FORMS, YOU MUST BRING THEM TO THE OFFICE FOR PROCESSING.  DO NOT GIVE THEM TO YOUR DOCTOR.  Take 2 tylenol  (acetominophen) three times a day for 3 days.  If you still have pain, add ibuprofen with food in between if able to take this (if you have kidney issues or stomach issues, do not take ibuprofen).  If both of those are not enough, add the narcotic pain pill.  If you find you are needing a lot of this overnight after surgery, call the next morning for a refill.    Prescriptions will not be filled after 5pm or on week-ends. Take your usually prescribed medications unless otherwise directed You should eat very light the first 24 hours after surgery, such as soup, crackers, pudding, etc.  Resume your normal diet the day after surgery. Most patients will experience some swelling and bruising in the breast.  Ice packs and a good support bra will help.  Swelling and bruising can take several days to resolve.  It is common to experience some constipation if taking pain medication after surgery.  Increasing fluid intake and taking a stool softener will usually help or prevent this problem from occurring.  A mild laxative (Milk of Magnesia or Miralax) should be taken according to package directions if there are no bowel movements after 48 hours. Unless discharge instructions indicate otherwise, you may remove your bandages 48 hours after surgery, and you may shower at that time.  You may have steri-strips (small skin tapes) in place directly over the incision.  These strips should be left on the skin at least for for 7-10 days.    ACTIVITIES:  You may resume regular daily activities (gradually increasing) beginning the next day.  Wearing a  good support bra or sports bra (or the breast binder) minimizes pain and swelling.  You may have sexual intercourse when it is comfortable. No heavy lifting for 1-2 weeks (not over around 10 pounds).  You may drive when you no longer are taking prescription pain medication, you can comfortably wear a seatbelt, and you can safely maneuver your car and apply brakes. RETURN TO WORK:  __________3-14 days depending on job. _______________ Tanya [name] should see your doctor in the office for a follow-up appointment approximately two weeks after your surgery.  Your doctors nurse will typically make your follow-up appointment when she calls you with your pathology report.  Expect your pathology report 3-4 business days after your surgery.  You may call to check if you do not hear from us  after three days.   WHEN TO CALL YOUR DOCTOR: Fever over 101.0 Nausea and/or vomiting. Extreme swelling or bruising. Continued bleeding from incision. Increased pain, redness, or drainage from the incision.  The clinic staff is available to answer your questions during regular business hours.  Please dont hesitate to call and ask to speak to one of the nurses for clinical concerns.  If you have a medical emergency, go to the nearest emergency room or call 911.  A surgeon from [facility] is always on call at the hospital.  For further questions, please visit [email or web address]  No Tylenol  until after 6:15pm today.   Post Anesthesia Home Care Instructions  Activity: Get plenty of rest for the remainder of the  day. A responsible individual must stay with you for 24 hours following the procedure.  For the next 24 hours, DO NOT: -Drive a car -Advertising copywriter -Drink alcoholic beverages -Take any medication unless instructed by your physician -Make any legal decisions or sign important papers.  Meals: Start with liquid foods such as gelatin or soup. Progress to regular foods as tolerated. Avoid  greasy, spicy, heavy foods. If nausea and/or vomiting occur, drink only clear liquids until the nausea and/or vomiting subsides. Call your physician if vomiting continues.  Special Instructions/Symptoms: Your throat may feel dry or sore from the anesthesia or the breathing tube placed in your throat during surgery. If this causes discomfort, gargle with warm salt water. The discomfort should disappear within 24 hours.  If you had a scopolamine  patch placed behind your ear for the management of post- operative nausea and/or vomiting:  1. The medication in the patch is effective for 72 hours, after which it should be removed.  Wrap patch in a tissue and discard in the trash. Wash hands thoroughly with soap and water. 2. You may remove the patch earlier than 72 hours if you experience unpleasant side effects which may include dry mouth, dizziness or visual disturbances. 3. Avoid touching the patch. Wash your hands with soap and water after contact with the patch.

## 2024-11-23 NOTE — Anesthesia Preprocedure Evaluation (Addendum)
"                                    Anesthesia Evaluation  Patient identified by MRN, date of birth, ID band Patient awake    Reviewed: Allergy & Precautions, NPO status , Patient's Chart, lab work & pertinent test results  Airway Mallampati: II  TM Distance: >3 FB Neck ROM: Full    Dental  (+) Teeth Intact, Dental Advisory Given   Pulmonary asthma    Pulmonary exam normal breath sounds clear to auscultation       Cardiovascular hypertension, Pt. on medications + Peripheral Vascular Disease (Aneurysm of ascending aorta without rupture)  Normal cardiovascular exam Rhythm:Regular Rate:Normal     Neuro/Psych negative neurological ROS  negative psych ROS   GI/Hepatic negative GI ROS, Neg liver ROS,,,  Endo/Other  diabetes, Type 2  Obesity   Renal/GU negative Renal ROS     Musculoskeletal negative musculoskeletal ROS (+)    Abdominal   Peds  Hematology negative hematology ROS (+)   Anesthesia Other Findings Day of surgery medications reviewed with the patient.  Ductal carcinoma of left breast  Reproductive/Obstetrics                              Anesthesia Physical Anesthesia Plan  ASA: 3  Anesthesia Plan: General   Post-op Pain Management: Regional block*, Tylenol  PO (pre-op)* and Toradol IV (intra-op)*   Induction: Intravenous  PONV Risk Score and Plan: 3 and Midazolam , Dexamethasone  and Ondansetron   Airway Management Planned: LMA  Additional Equipment:   Intra-op Plan:   Post-operative Plan: Extubation in OR  Informed Consent: I have reviewed the patients History and Physical, chart, labs and discussed the procedure including the risks, benefits and alternatives for the proposed anesthesia with the patient or authorized representative who has indicated his/her understanding and acceptance.     Dental advisory given  Plan Discussed with: CRNA  Anesthesia Plan Comments:          Anesthesia Quick  Evaluation  "

## 2024-11-23 NOTE — Transfer of Care (Signed)
 Immediate Anesthesia Transfer of Care Note  Patient: Tanya [name]  Procedure(s) Performed: LEFT BREAST LUMPECTOMY WITH MAGNETIC MARKER LOCALIZATION (Left: Breast) LEFT SENTINEL NODE BIOPSY (Left: Axilla) INSERTION TUNNELED CENTRAL VENOUS DEVICE WITH PORT (Left: Chest)  Patient Location: PACU  Anesthesia Type:General  Level of Consciousness: awake and alert   Airway & Oxygen Therapy: Patient Spontanous Breathing and Patient connected to face mask oxygen  Post-op Assessment: Report given to RN and Post -op Vital signs reviewed and stable  Post vital signs: Reviewed and stable  Last Vitals:  Vitals Value Taken Time  BP 121/78 11/23/24 16:33  Temp    Pulse 68 11/23/24 16:36  Resp 12 11/23/24 16:36  SpO2 99 % 11/23/24 16:36  Vitals shown include unfiled device data.  Last Pain:  Vitals:   11/23/24 1211  TempSrc: Temporal  PainSc: 0-No pain      Patients Stated Pain Goal: 3 (11/23/24 1211)  Complications: No notable events documented.

## 2024-11-23 NOTE — Progress Notes (Signed)
Assisted Dr. Turk with left, pectoralis, ultrasound guided block. Side rails up, monitors on throughout procedure. See vital signs in flow sheet. Tolerated Procedure well. 

## 2024-11-23 NOTE — Op Note (Signed)
 Left subclavian port placement, left Breast Magnetic seed localized lumpectomy and sentinel lymph node biopsy  Indications: This patient presents with history of left breast cancer, cT1bN0 grade 3 invasive ductal carcinoma, upper inner quadrant, ER-/PR-/Her2-  Pre-operative Diagnosis: left breast cancer  Post-operative Diagnosis: Same  Surgeon: ARON SHOULDERS   Assistant: n/a  Anesthesia: General endotracheal anesthesia  ASA Class: 3  Procedure Details  The patient was seen in the Holding Room. The risks, benefits, complications, treatment options, and expected outcomes were discussed with the patient. The possibilities of bleeding, infection, the need for additional procedures, failure to diagnose a condition, and creating a complication requiring transfusion or operation were discussed with the patient. The patient concurred with the proposed plan, giving informed consent.  The site of surgery properly noted/marked. The patient was taken to Operating Room # 8, identified, and the procedure verified as port placement, Left Breast Seed localized Lumpectomy with sentinel lymph node biopsy. The bilateral breast, chest, and left arm were prepped and draped in standard fashion. A Time Out was held and the above information confirmed. The MagTrace was injected into the left subareolar position.   Local anesthetic was administered just under the angle of the left clavicle.  The vein was accessed with 3 pass(es) of the needle. There was good venous return and the wire passed easily with no ectopy.   Fluoroscopy was used to confirm that the wire was in the vena cava.      The patient was placed back level and the area for the pocket was anethetized   with local anesthetic.  A 3-cm transverse incision was made with a #15   blade.  Cautery was used to divide the subcutaneous tissues down to the   pectoralis muscle.  An Army[employer]Nav[employer] retractor was used to elevate the skin   while a pocket was created on top  of the pectoralis fascia.  The port   was placed into the pocket to confirm that it was of adequate size.  The   catheter was preattached to the port.  The port was then secured to the   pectoralis fascia with four 2-0 Prolene sutures.  These were clamped and   not tied down yet.    The catheter was tunneled through to the wire exit   site.  The catheter was placed along the wire to determine what length it should be to be in the SVC.  The catheter was cut at 25 cm.  The tunneler sheath and dilator were passed over the wire and the dilator and wire were removed.  The catheter was advanced through the tunneler sheath and the tunneler sheath was pulled away.  Care was taken to keep the catheter in the tunneler sheath as this occurred. This was advanced and the tunneler sheath was removed.  There was good venous  return and easy flush of the catheter.  The Prolene sutures were tied   down to the pectoral fascia.  The port was rechecked, and the catheter had retracted a bit.  The insertion site was opened up some and the catheter dissected away from the additional tissues.  This was then pushed in further and rechecked with fluoro.  At that point, the catheter was at the cavoatrial junction.  The skin was reapproximated using 3-0   Vicryl interrupted deep dermal sutures.    Fluoroscopy was used to re-confirm good position of the catheter.  The skin   was then closed using 4-0 Monocryl in a subcuticular fashion.  The port was flushed with concentrated heparin  flush as well.  The wounds were then cleaned, dried, and dressed with Dermabond.  The lumpectomy was performed by creating a superomedial circumareolar incision near the previously placed radioactive seed.  Dissection was carried down to around the point of maximum signal intensity. The cautery was used to perform the dissection.  Hemostasis was achieved with cautery. The edges of the cavity were marked with large clips.   The specimen was inked with  the margin marker paint kit.    Specimen radiography confirmed inclusion of the mammographic lesion, the clip, and the seed.  The background signal in the breast was zero.  3D specimen radiograph showed tissue around the mass, so additional tissue was not taken.  The wound was irrigated and reinspected for hemostasis.  Several 2-0 vicryl sutures were used to close down the defect.  The skin was then closed with 3-0 vicryl in layers and 4-0 monocryl subcuticular suture.    Using a sentimag probe, left axillary sentinel nodes were identified transcutaneously.  An oblique incision was created below the axillary hairline.  Dissection was carried through the clavipectoral fascia.  Three level 1 and 2 axillary sentinel nodes were removed.  Counts per second were below.    The background count was -150 cps.  The wound was irrigated.  Hemostasis was achieved with cautery.  The axillary incision was closed with a 3-0 vicryl deep dermal interrupted sutures and a 4-0 monocryl subcuticular closure.    Sterile dressings were applied. At the end of the operation, all sponge, instrument, and needle counts were correct.  Findings: grossly clear surgical margins and no adenopathy, anterior margin is skin.    Estimated Blood Loss:  min         Specimens:   left breast tissue with seed Left SLN #1, cps 890 SLN #2 cps 39 SLN #3 cps 61         Complications:  None; patient tolerated the procedure well.         Disposition: PACU - hemodynamically stable.         Condition: stable

## 2024-11-23 NOTE — Interval H&P Note (Signed)
 History and Physical Interval Note:  11/23/2024 1:55 PM  Tanya [name]  has presented today for surgery, with the diagnosis of LEFT BREAST CANCER.  The various methods of treatment have been discussed with the patient and family. After consideration of risks, benefits and other options for treatment, the patient has consented to  Procedures with comments: LUMPECTOMY WITH MAGNETIC MARKER LOCALIZATION (Left) - GEN w/REGIONAL BIOPSY, LYMPH NODE, SENTINEL (Left) INSERTION, TUNNELED CENTRAL VENOUS DEVICE, WITH PORT (N/A) - PORT PLACEMENT WITH ULTRASOUND GUIDANCE as a surgical intervention.  The patient's history has been reviewed, patient examined, no change in status, stable for surgery.  I have reviewed the patient's chart and labs.  Questions were answered to the patient's satisfaction.     [name]

## 2024-11-23 NOTE — Anesthesia Procedure Notes (Signed)
 Procedure Name: LMA Insertion Date/Time: 11/23/2024 2:21 PM  Performed by: Burnard Rosaline HERO, CRNAPre-anesthesia Checklist: Patient identified, Emergency Drugs available, Suction available and Patient being monitored Patient Re-evaluated:Patient Re-evaluated prior to induction Oxygen Delivery Method: Circle system utilized Preoxygenation: Pre-oxygenation with 100% oxygen Induction Type: IV induction Ventilation: Mask ventilation without difficulty LMA: LMA inserted LMA Size: 4.0 Number of attempts: 1 Airway Equipment and Method: Bite block Placement Confirmation: positive ETCO2, breath sounds checked- equal and bilateral and CO2 detector Tube secured with: Tape Dental Injury: Teeth and Oropharynx as per pre-operative assessment

## 2024-11-23 NOTE — Anesthesia Postprocedure Evaluation (Signed)
"   Anesthesia Post Note  Patient: Tanya [name]  Procedure(s) Performed: LEFT BREAST LUMPECTOMY WITH MAGNETIC MARKER LOCALIZATION (Left: Breast) LEFT SENTINEL NODE BIOPSY (Left: Axilla) INSERTION TUNNELED CENTRAL VENOUS DEVICE WITH PORT (Left: Chest)     Patient location during evaluation: PACU Anesthesia Type: General Level of consciousness: awake and alert, oriented and patient cooperative Pain management: pain level controlled Vital Signs Assessment: post-procedure vital signs reviewed and stable Respiratory status: spontaneous breathing, nonlabored ventilation and respiratory function stable Cardiovascular status: blood pressure returned to baseline and stable Postop Assessment: no apparent nausea or vomiting Anesthetic complications: no   No notable events documented.  Last Vitals:  Vitals:   11/23/24 1715 11/23/24 1740  BP: (!) 118/94 (!) 145/76  Pulse: 62 (!) 56  Resp: 14 14  Temp:  (!) 36.3 C  SpO2: 94% 94%    Last Pain:  Vitals:   11/23/24 1740  TempSrc:   PainSc: 0-No pain                 [name]      "

## 2024-11-24 ENCOUNTER — Encounter (HOSPITAL_BASED_OUTPATIENT_CLINIC_OR_DEPARTMENT_OTHER): Payer: Self-pay | Admitting: General Surgery

## 2024-11-28 ENCOUNTER — Ambulatory Visit: Payer: Self-pay | Admitting: General Surgery

## 2024-11-28 LAB — SURGICAL PATHOLOGY

## 2024-11-30 ENCOUNTER — Encounter: Payer: Self-pay | Admitting: *Deleted

## 2024-11-30 MED ORDER — BUPIVACAINE-EPINEPHRINE (PF) 0.5% -1:200000 IJ SOLN
INTRAMUSCULAR | Status: DC | PRN
  Administered 2024-11-23: 30 mL via PERINEURAL

## 2024-11-30 MED ORDER — CLONIDINE HCL (ANALGESIA) 100 MCG/ML EP SOLN
EPIDURAL | Status: DC | PRN
  Administered 2024-11-23: 50 ug

## 2024-11-30 NOTE — Addendum Note (Signed)
 Addendum  created 11/30/24 2038 by Corinne Garnette BRAVO, MD   Child order released for a procedure order, Clinical Note Signed, Intraprocedure Blocks edited, Intraprocedure Meds edited, SmartForm saved

## 2024-11-30 NOTE — Anesthesia Procedure Notes (Signed)
 Anesthesia Regional Block: Pectoralis block   Pre-Anesthetic Checklist: , timeout performed,  Correct Patient, Correct Site, Correct Laterality,  Correct Procedure, Correct Position, site marked,  Risks and benefits discussed,  Surgical consent,  Pre-op evaluation,  At surgeon's request and post-op pain management  Laterality: Left  Prep: chloraprep       Needles:  Injection technique: Single-shot  Needle Type: Echogenic Needle     Needle Length: 9cm  Needle Gauge: 21     Additional Needles:   Procedures:,,,, ultrasound used (permanent image in chart),,    Narrative:  Start time: 11/23/2024 1:27 PM End time: 11/23/2024 1:33 PM Injection made incrementally with aspirations every 5 mL.  Performed by: Personally  Anesthesiologist: Corinne Garnette BRAVO, MD  Additional Notes: No pain on injection. No increased resistance to injection. Injection made in 5cc increments.  Good needle visualization.  Patient tolerated procedure well.

## 2024-12-01 ENCOUNTER — Encounter: Payer: Self-pay | Admitting: General Practice

## 2024-12-01 NOTE — Progress Notes (Signed)
 [facility] Note  Left follow-up voicemail with direct number, encouraging return call.  [address] [place], [place], [facility] Pager [phone] Voicemail [phone]

## 2024-12-13 ENCOUNTER — Other Ambulatory Visit: Payer: Self-pay | Admitting: Hematology

## 2024-12-13 DIAGNOSIS — C50912 Malignant neoplasm of unspecified site of left female breast: Secondary | ICD-10-CM

## 2024-12-13 MED ORDER — PROCHLORPERAZINE MALEATE 10 MG PO TABS: 10.0000 mg | ORAL_TABLET | Freq: Four times a day (QID) | ORAL | 1 refills | Status: AC | PRN

## 2024-12-13 MED ORDER — LIDOCAINE-PRILOCAINE 2.5-2.5 % EX CREA: TOPICAL_CREAM | CUTANEOUS | 3 refills | Status: AC

## 2024-12-13 MED ORDER — ONDANSETRON HCL 8 MG PO TABS: 8.0000 mg | ORAL_TABLET | Freq: Three times a day (TID) | ORAL | 1 refills | Status: AC | PRN

## 2024-12-13 NOTE — Progress Notes (Signed)
 START ON PATHWAY REGIMEN - Breast     Cycle 1: A cycle is 7 days:     Trastuzumab-xxxx      Paclitaxel    Cycles 2 through 12: A cycle is every 7 days:     Trastuzumab-xxxx      Paclitaxel    Cycles 13 through 25: A cycle is every 21 days:     Trastuzumab-xxxx   **Always confirm dose/schedule in your pharmacy ordering system**  Patient Characteristics: Postoperative without Neoadjuvant Therapy, M0 (Pathologic Staging), Invasive Disease, Adjuvant Therapy, HER2 Positive, ER Negative, Node Negative, pT1c, pN0/N70mi Therapeutic Status: Postoperative without Neoadjuvant Therapy, M0 (Pathologic Staging) AJCC T Category: pT1c AJCC N Category: pN0 AJCC M Category: cM0 AJCC Grade: G3 ER Status: Negative (-) PR Status: Negative (-) HER2 Status: Positive (+) Oncotype Dx Recurrence Score: Not Appropriate AJCC 8 Stage Grouping: IA Intent of Therapy: Curative Intent, Discussed with Patient

## 2024-12-14 ENCOUNTER — Inpatient Hospital Stay: Admitting: Pharmacist

## 2024-12-14 ENCOUNTER — Inpatient Hospital Stay

## 2024-12-14 ENCOUNTER — Inpatient Hospital Stay: Attending: Hematology | Admitting: Hematology

## 2024-12-14 ENCOUNTER — Other Ambulatory Visit: Payer: Self-pay

## 2024-12-14 VITALS — BP 137/87 | HR 77 | Temp 97.8°F | Resp 17 | Ht 64.0 in | Wt 192.2 lb

## 2024-12-14 DIAGNOSIS — C50212 Malignant neoplasm of upper-inner quadrant of left female breast: Secondary | ICD-10-CM | POA: Diagnosis present

## 2024-12-14 DIAGNOSIS — Z1732 Human epidermal growth factor receptor 2 negative status: Secondary | ICD-10-CM | POA: Diagnosis not present

## 2024-12-14 DIAGNOSIS — Z1722 Progesterone receptor negative status: Secondary | ICD-10-CM | POA: Insufficient documentation

## 2024-12-14 DIAGNOSIS — Z171 Estrogen receptor negative status [ER-]: Secondary | ICD-10-CM | POA: Insufficient documentation

## 2024-12-14 DIAGNOSIS — C50912 Malignant neoplasm of unspecified site of left female breast: Secondary | ICD-10-CM

## 2024-12-14 NOTE — Assessment & Plan Note (Signed)
 pT1cN0M0, stage IA invasive ductal carcinoma, ER and PR negative, HER2 positive, grade 3 -Diagnosed in December 2025, found on screening mammogram. -Status post lumpectomy and sentinel lymph node biopsy, which showed a 1.4 cm invasive ductal carcinoma, negative lymph nodes and margins. -I recommend adjuvant chemotherapy with weekly paclitaxel for 12 weeks, and trastuzumab for 1 year

## 2024-12-14 NOTE — Progress Notes (Signed)
 " [facility]        Telephone: [phone]?Fax: [phone]   Oncology Clinical Pharmacist Practitioner Initial Assessment   Tanya [name] is a 64 y.o. female with a diagnosis of breast cancer. They were contacted today via in-person visit.  Indication/Regimen Trastuzumab (Herceptin) and Paclitaxel (Taxol) are being used appropriately for treatment of breast cancer by Dr. Onita Mattock.      Wt Readings from Last 1 Encounters:  12/14/24 192 lb 3.2 oz (87.2 kg)    Estimated body surface area is 1.98 meters squared as calculated from the following:   Height as of an earlier encounter on 12/14/24: 5' 4 (1.626 m).   Weight as of an earlier encounter on 12/14/24: 192 lb 3.2 oz (87.2 kg).  The dosing regimen is weekly (Day 1, Day 8, Day 15, Day 22) for 3 cycles  Trastuzumab (4 mg/kg load, 2 mg/kg maintenance) on Day 1 Paclitaxel (80 mg/m2) on Day 1  Followed by a dosing regimen that is every 21 days for 13 cycles (starting week 13)  Trastuzumab (6 mg/kg maintenance) on Day 1  Dose Modifications None   Allergies Allergies[1]  Vitals    12/14/2024    1:59 PM 11/23/2024    5:40 PM 11/23/2024    5:15 PM  Oncology Vitals  Height 163 cm    Weight 87.181 kg    Weight (lbs) 192 lbs 3 oz    BMI 32.99 kg/m2    Temp 97.8 F (36.6 C) 97.3 F (36.3 C)   Pulse Rate 77 56 62  BP 137/87 145/76 118/94  Resp 17 14 14   SpO2 99 % 94 % 94 %  BSA (m2) 1.98 m2       Laboratory Data    Latest Ref Rng & Units 11/01/2024   12:34 PM 08/02/2024   10:23 AM 10/20/2023    9:50 AM  CBC EXTENDED  WBC 4.0 - 10.5 K/uL 6.5  4.7  4.8   RBC 3.87 - 5.11 MIL/uL 4.69  4.22  4.56   Hemoglobin 12.0 - 15.0 g/dL 85.2  86.9  85.6   HCT 36.0 - 46.0 % 42.2  39.5  43.4   Platelets 150 - 400 K/uL 246  386.0  222.0   NEUT# 1.7 - 7.7 K/uL 3.4  2.8  2.5   Lymph# 0.7 - 4.0 K/uL 2.6  1.4  1.7        Latest Ref Rng & Units 11/01/2024   12:34 PM 08/09/2024    9:04 AM 08/02/2024   10:23 AM   CMP  Glucose 70 - 99 mg/dL 840   873   BUN 8 - 23 mg/dL 18   17   Creatinine 9.55 - 1.00 mg/dL 9.07   9.22   Sodium 864 - 145 mmol/L 139   138   Potassium 3.5 - 5.1 mmol/L 4.3   4.2   Chloride 98 - 111 mmol/L 103   100   CO2 22 - 32 mmol/L 24   26   Calcium 8.9 - 10.3 mg/dL 89.8   89.7   Total Protein 6.5 - 8.1 g/dL 8.0  7.8  7.8   Total Bilirubin 0.0 - 1.2 mg/dL 0.3  0.6  0.5   Alkaline Phos 38 - 126 U/L 81  94  137   AST 15 - 41 U/L 26  20  51   ALT 0 - 44 U/L 31  34  102     Contraindications Contraindications were reviewed? Yes  Contraindications to therapy were identified? No   Safety Precautions (written information also provided) The following safety precautions for the use of trastuzumab + paclitaxel were reviewed:  Fever: reviewed the importance of having a thermometer and the Centers for Disease Control and Prevention (CDC) definition of fever which is 100.106F (38C) or higher. Patient should call 24/7 triage at [phone] if experiencing a fever or any other symptoms Decreased white blood cells (WBCs) and increased risk for infection Decreased platelet count and increased risk of bleeding Decreased hemoglobin, part of the red blood cells that carry iron and oxygen Nausea or vomiting Diarrhea or constipation Hair Loss Fatigue Changes in liver function Peripheral Neuropathy Muscle or joint pain or weakness Mouth Irritation or sores Nail Changes Hypersensitivity reactions Cardiotoxicity Pneumonitis Irritant Headache Handling body fluids and waste Intimacy, sexual activity, contraception, and fertility  Medication Reconciliation Current Outpatient Medications  Medication Sig Dispense Refill   aspirin EC 81 MG tablet Take 81 mg by mouth daily.     Calcium-Magnesium-Vitamin D (CALCIUM MAGNESIUM PO) Take by mouth.     cholecalciferol (VITAMIN D) 1000 UNITS tablet Take 1,000 Units by mouth daily.     clindamycin (CLEOCIN) 150 MG capsule Take 150 mg by mouth 3  (three) times daily.     lidocaine -prilocaine  (EMLA ) cream Apply to affected area once 30 g 3   losartan  (COZAAR ) 25 MG tablet TAKE 1 TABLET (25 MG TOTAL) BY MOUTH DAILY. 90 tablet 3   Multiple Vitamin (MULTIVITAMIN) tablet Take 1 tablet by mouth daily.     Nutritional Supplements (JUICE PLUS FIBRE PO) Take 1 tablet by mouth daily.     Omega-3 Fatty Acids (FISH OIL) 1200 MG CAPS Take 1 capsule daily by mouth.     ondansetron  (ZOFRAN ) 8 MG tablet Take 1 tablet (8 mg total) by mouth every 8 (eight) hours as needed for nausea or vomiting. 30 tablet 1   oxyCODONE  (OXY IR/ROXICODONE ) 5 MG immediate release tablet Take 1 tablet (5 mg total) by mouth every 6 (six) hours as needed for severe pain (pain score 7-10). 5 tablet 0   prochlorperazine  (COMPAZINE ) 10 MG tablet Take 1 tablet (10 mg total) by mouth every 6 (six) hours as needed for nausea or vomiting. 30 tablet 1   No current facility-administered medications for this visit.    Medication reconciliation is based on the patient's most recent medication list in the electronic medical record (EMR) including herbal products and OTC medications.   The patient's medication list was reviewed today with the patient? Yes   Drug-drug interactions (DDIs) DDIs were evaluated? Yes Significant DDIs identified? No   Drug-Food Interactions Drug-food interactions were evaluated? Yes Drug-food interactions identified? Avoid grapefruit products  Follow-up Plan  Treatment start date: 12/19/24 planned but not scheduled. Notified scheduling Port placement date: 1/826 ECHO date: 11/21/24 We reviewed the prescriptions, premedications, and treatment regimen with the patient. Possible side effects of the treatment regimen were reviewed and management strategies were discussed  Clinical pharmacy will assist Dr. Onita Mattock and [name] on an as needed basis going forward  Fifi Seabrooks participated in the discussion, expressed understanding, and voiced  agreement with the above plan. All questions were answered to their satisfaction. The patient was advised to contact the clinic at [phone] with any questions or concerns prior to their return visit.   I spent 60 minutes assessing the patient.  Lyndall Windt A. Lucila, PharmD, BCOP, CPP  Norleen DELENA Lucila, RPH-CPP, 12/14/2024 3:30 PM  **Disclaimer: This note  was dictated with voice recognition software. Similar sounding words can inadvertently be transcribed and this note may contain transcription errors which may not have been corrected upon publication of note.**      [1] No Known Allergies  "

## 2024-12-14 NOTE — Progress Notes (Signed)
 " [facility]   [phone] [phone]   Clinic Follow up Note   Patient Care Team: Allwardt, Mardy HERO, PA-C as PCP - General (Physician Assistant) Key, Hargis HERO, NP as Nurse Practitioner (Gynecology) Mammography, [facility] (Diagnostic Radiology) Tyree Nanetta SAILOR, RN as Oncology Nurse Navigator Aron Shoulders, MD as Consulting Physician (General Surgery) Lanny Callander, MD as Consulting Physician (Hematology) Dewey Rush, MD as Consulting Physician (Radiation Oncology)  Date of Service:  12/14/2024  CHIEF COMPLAINT: f/u of breast cancer  CURRENT THERAPY:  Any adjuvant chemotherapy weekly paclitaxel and trastuzumab  Oncology History   Malignant neoplasm of upper-inner quadrant of left breast in female, estrogen receptor negative (HCC) pT1cN0M0, stage IA invasive ductal carcinoma, ER and PR negative, HER2 positive, grade 3 -Diagnosed in December 2025, found on screening mammogram. -Status post lumpectomy and sentinel lymph node biopsy, which showed a 1.4 cm invasive ductal carcinoma, negative lymph nodes and margins. -I recommend adjuvant chemotherapy with weekly paclitaxel for 12 weeks, and trastuzumab for 1 year  Assessment & Plan Stage I HER2-positive invasive ductal carcinoma of the left breast Status post surgical resection with negative margins and four negative sentinel lymph nodes. Adjuvant chemotherapy and HER2-targeted therapy initiated to reduce recurrence risk. Prognosis is excellent given node-negative, small tumor, and curative intent therapy. Radiation therapy planned for local control post-chemotherapy. Port and surgical site are healing well. Therapy schedule tailored to her preference for Tuesday infusions. - Initiated weekly paclitaxel for 12 weeks via port. - Initiated concurrent trastuzumab weekly for 12 weeks, then transition to maintenance trastuzumab every three weeks for a total of one year, via infusion or subcutaneous injection if  insurance approves. - Referred to chemotherapy education class and provided educational materials regarding regimen, side effects, and survivorship. - Prescribed oral antiemetics as needed for nausea. - Prescribed lidocaine  cream for port site discomfort or irritation. - Discussed chemotherapy-induced alopecia; she declined scalp cooling and prefers hats or a wig for special occasions. Provided information on wig resources and insurance coverage. - Plan to remove port after completion of all infusions, or after three months if transitioned to subcutaneous injection and insurance approves. - Accommodated preference for Tuesday chemotherapy sessions to support participation in recreational activities. - Scheduled follow-up with surgical oncology next week. - Plan for adjuvant radiation therapy to the breast (20 daily fractions) to begin approximately one month after chemotherapy completion (anticipated late May or early June 2026).  Plan - Surgical path reviewed with patient and her husband. - I recommend adjuvant weekly paclitaxel and trastuzumab for 12 weeks, followed by maintenance trastuzumab for 9 months. - She is scheduled to to have chemo class today -plan to start chemo next week, follow-up every other week.   SUMMARY OF ONCOLOGIC HISTORY: Oncology History  Ductal carcinoma of left breast (HCC)  10/25/2024 Initial Diagnosis   Ductal carcinoma of left breast (HCC)   12/19/2024 -  Chemotherapy   Patient is on Treatment Plan : BREAST Paclitaxel + Trastuzumab q7d / Trastuzumab q21d     Malignant neoplasm of upper-inner quadrant of left breast in female, estrogen receptor negative (HCC)  10/20/2024 Cancer Staging   Staging form: Breast, AJCC 8th Edition - Clinical stage from 10/20/2024: Stage IA (cT1b, cN0, cM0, G3, ER-, PR-, HER2+) - Signed by Lanny Callander, MD on 11/01/2024 Stage prefix: Initial diagnosis Histologic grading system: 3 grade system   10/20/2024 Cancer Staging   Staging  form: Breast, AJCC 8th Edition - Pathologic stage from 10/20/2024: Stage IA (pT1c, pN0, cM0,  G3, ER-, PR-, HER2+) - Signed by Lanny Callander, MD on 12/14/2024 Stage prefix: Initial diagnosis Nuclear grade: G3 Histologic grading system: 3 grade system Residual tumor (R): R0   10/30/2024 Initial Diagnosis   Malignant neoplasm of upper-inner quadrant of left breast in female, estrogen receptor negative (HCC)   11/08/2024 Genetic Testing   Negative genetic testing The results date is November 08, 2024  The CancerNext-Expanded gene panel offered by [employer] and includes sequencing, rearrangement, and RNA analysis for the following 77 genes: AIP, ALK, APC, ATM, BAP1, BARD1, BMPR1A, BRCA1, BRCA2, BRIP1, CDC73, CDH1, CDK4, CDKN1B, CDKN2A, CEBPA, CHEK2, CTNNA1, DDX41, DICER1, ETV6, FH, FLCN, GATA2, LZTR1, MAX, MBD4, MEN1, MET, MLH1, MSH2, MSH3, MSH6, MUTYH, NF1, NF2, NTHL1, PALB2, PHOX2B, PMS2, POT1, PRKAR1A, PTCH1, PTEN, RAD51C, RAD51D, RB1, RET, RPS20, RUNX1, SDHA, SDHAF2, SDHB, SDHC, SDHD, SMAD4, SMARCA4, SMARCB1, SMARCE1, STK11, SUFU, TMEM127, TP53, TSC1, TSC2, VHL, and WT1 (sequencing and deletion/duplication); AXIN2, CTNNA1, DDX41, EGFR, HOXB13, KIT, MBD4, MITF, MSH3, PDGFRA, POLD1 and POLE (sequencing only); EPCAM and GREM1 (deletion/duplication only). RNA data is routinely analyzed for use in variant interpretation for all genes.       Discussed the use of AI scribe software for clinical note transcription with the patient, who gave verbal consent to proceed.  History of Present Illness Tanya [name] is a 64 year old female with recently resected stage I HER2-positive, ER/PR-negative invasive ductal carcinoma of the left breast who presents for post-operative oncology follow-up and initiation of adjuvant chemotherapy and HER2-targeted therapy.  She underwent lumpectomy and sentinel lymph node biopsy for a 1.4 cm left breast tumor. Four sentinel nodes were negative and margins were clear. She  has no lump, induration, or tenderness at the incision, with only mild soreness at the lymph node area and port site.  She has not started adjuvant therapy. She is planned for weekly paclitaxel for 12 weeks with concurrent trastuzumab, then trastuzumab every 3 weeks for one year. She declined scalp cooling and plans to use hats or a wig. She has not yet used antiemetics or lidocaine  cream for port discomfort.  She chose Tuesday infusions to fit her schedule. She understands that adjuvant radiation will follow chemotherapy and that port removal will be considered after completion of infusions or transition to subcutaneous HER2 therapy. Her questions about hair loss and wig options were addressed. She reports no new symptoms or concerns relevant to treatment initiation.  Dec 13, 2024: Postoperative adjuvant therapy visit for HER2-positive, ER-negative, node-negative invasive breast cancer; initiated Pathway Regimen with Trastuzumab and Paclitaxel (cycle 1, day 1) following surgery, with curative intent. No neoadjuvant therapy given; patient staged as pT1c, pN0, cM0, AJCC Stage IA, grade 3.     All other systems were reviewed with the patient and are negative.  MEDICAL HISTORY:  Past Medical History:  Diagnosis Date   AAA (abdominal aortic aneurysm) ~2021   checked based on family history   Allergy    Asthma    child [place] asthma  ONLY    Family history of leukemia    History of chickenpox    Hx of adenomatous polyp of colon 11/27/2015   Hypertension    under control with meds    Plantar fasciitis    Vaginal tear resulting from childbirth    occ will bleed after large bm with this     SURGICAL HISTORY: Past Surgical History:  Procedure Laterality Date   CESAREAN SECTION  1997   COLONOSCOPY     PORTACATH PLACEMENT Left 11/23/2024  Procedure: INSERTION TUNNELED CENTRAL VENOUS DEVICE WITH PORT;  Surgeon: Aron Shoulders, MD;  Location: [facility];  Service: General;   Laterality: Left;  PORT PLACEMENT WITH ULTRASOUND GUIDANCE   SENTINEL NODE BIOPSY Left 11/23/2024   Procedure: LEFT SENTINEL NODE BIOPSY;  Surgeon: Aron Shoulders, MD;  Location: [facility];  Service: General;  Laterality: Left;   VAGINAL DELIVERY     x2   WISDOM TOOTH EXTRACTION      I have reviewed the social history and family history with the patient and they are unchanged from previous note.  ALLERGIES:  has no known allergies.  MEDICATIONS:  Current Outpatient Medications  Medication Sig Dispense Refill   aspirin EC 81 MG tablet Take 81 mg by mouth daily.     Calcium-Magnesium-Vitamin D (CALCIUM MAGNESIUM PO) Take by mouth.     cholecalciferol (VITAMIN D) 1000 UNITS tablet Take 1,000 Units by mouth daily.     lidocaine -prilocaine  (EMLA ) cream Apply to affected area once (Patient not taking: Reported on 12/14/2024) 30 g 3   losartan  (COZAAR ) 25 MG tablet TAKE 1 TABLET (25 MG TOTAL) BY MOUTH DAILY. 90 tablet 3   MILK THISTLE PO Take by mouth daily.     Multiple Vitamin (MULTIVITAMIN) tablet Take 1 tablet by mouth daily.     Nutritional Supplements (JUICE PLUS FIBRE PO) Take 1 tablet by mouth daily.     Omega-3 Fatty Acids (FISH OIL) 1200 MG CAPS Take 1 capsule daily by mouth.     ondansetron  (ZOFRAN ) 8 MG tablet Take 1 tablet (8 mg total) by mouth every 8 (eight) hours as needed for nausea or vomiting. (Patient not taking: Reported on 12/14/2024) 30 tablet 1   prochlorperazine  (COMPAZINE ) 10 MG tablet Take 1 tablet (10 mg total) by mouth every 6 (six) hours as needed for nausea or vomiting. (Patient not taking: Reported on 12/14/2024) 30 tablet 1   No current facility-administered medications for this visit.    PHYSICAL EXAMINATION: ECOG PERFORMANCE STATUS: 0 - Asymptomatic  Vitals:   12/14/24 1359  BP: 137/87  Pulse: 77  Resp: 17  Temp: 97.8 F (36.6 C)  SpO2: 99%   Wt Readings from Last 3 Encounters:  12/14/24 192 lb 3.2 oz (87.2 kg)  11/23/24 190 lb 0.6 oz  (86.2 kg)  11/01/24 194 lb (88 kg)     GENERAL:alert, no distress and comfortable SKIN: skin color, texture, turgor are normal, no rashes or significant lesions EYES: normal, Conjunctiva are pink and non-injected, sclera clear NECK: supple, thyroid normal size, non-tender, without nodularity LYMPH:  no palpable lymphadenopathy in the cervical, axillary  LUNGS: clear to auscultation and percussion with normal breathing effort HEART: regular rate & rhythm and no murmurs and no lower extremity edema ABDOMEN:abdomen soft, non-tender and normal bowel sounds Musculoskeletal:no cyanosis of digits and no clubbing  NEURO: alert & oriented x 3 with fluent speech, no focal motor/sensory deficits Breasts: Breast inspection showed them to be symmetrical with no nipple discharge.  Both surgical incisions in breast and axilla have healed well.  Physical Exam    LABORATORY DATA:  I have reviewed the data as listed    Latest Ref Rng & Units 11/01/2024   12:34 PM 08/02/2024   10:23 AM 10/20/2023    9:50 AM  CBC  WBC 4.0 - 10.5 K/uL 6.5  4.7  4.8   Hemoglobin 12.0 - 15.0 g/dL 85.2  86.9  85.6   Hematocrit 36.0 - 46.0 % 42.2  39.5  43.4   Platelets 150 - 400 K/uL 246  386.0  222.0         Latest Ref Rng & Units 11/01/2024   12:34 PM 08/09/2024    9:04 AM 08/02/2024   10:23 AM  CMP  Glucose 70 - 99 mg/dL 840   873   BUN 8 - 23 mg/dL 18   17   Creatinine 9.55 - 1.00 mg/dL 9.07   9.22   Sodium 864 - 145 mmol/L 139   138   Potassium 3.5 - 5.1 mmol/L 4.3   4.2   Chloride 98 - 111 mmol/L 103   100   CO2 22 - 32 mmol/L 24   26   Calcium 8.9 - 10.3 mg/dL 89.8   89.7   Total Protein 6.5 - 8.1 g/dL 8.0  7.8  7.8   Total Bilirubin 0.0 - 1.2 mg/dL 0.3  0.6  0.5   Alkaline Phos 38 - 126 U/L 81  94  137   AST 15 - 41 U/L 26  20  51   ALT 0 - 44 U/L 31  34  102       RADIOGRAPHIC STUDIES: I have personally reviewed the radiological images as listed and agreed with the findings in the report. No  results found.    No orders of the defined types were placed in this encounter.  All questions were answered. The patient knows to call the clinic with any problems, questions or concerns. No barriers to learning was detected. The total time spent in the appointment was 40 minutes, including review of chart and various tests results, discussions about plan of care and coordination of care plan     Onita Mattock, MD 12/14/2024     "

## 2024-12-18 NOTE — Progress Notes (Signed)
 Pharmacist Chemotherapy Monitoring - Initial Assessment    Anticipated start date: 12/19/24   The following has been reviewed per standard work regarding the patient's treatment regimen: The patient's diagnosis, treatment plan and drug doses, and organ/hematologic function Lab orders and baseline tests specific to treatment regimen  Baseline ECHO done 11/21/24 -EF 55%, reviewed by Dr. Lanny The treatment plan start date, drug sequencing, and pre-medications Prior authorization status  Patient's documented medication list, including drug-drug interaction screen and prescriptions for anti-emetics and supportive care specific to the treatment regimen The drug concentrations, fluid compatibility, administration routes, and timing of the medications to be used The patient's access for treatment and lifetime cumulative dose history, if applicable  The patient's medication allergies and previous infusion related reactions, if applicable   Changes made to treatment plan:  N/A  Follow up needed:  N/A  Tanya [name], RPH, 12/18/2024  2:07 PM

## 2024-12-19 ENCOUNTER — Inpatient Hospital Stay

## 2024-12-19 ENCOUNTER — Inpatient Hospital Stay: Admitting: Hematology

## 2024-12-19 VITALS — BP 101/97 | HR 79 | Temp 98.4°F | Resp 18 | Ht 64.0 in | Wt 193.0 lb

## 2024-12-19 DIAGNOSIS — C50212 Malignant neoplasm of upper-inner quadrant of left female breast: Secondary | ICD-10-CM

## 2024-12-19 DIAGNOSIS — Z171 Estrogen receptor negative status [ER-]: Secondary | ICD-10-CM | POA: Diagnosis not present

## 2024-12-19 DIAGNOSIS — C50912 Malignant neoplasm of unspecified site of left female breast: Secondary | ICD-10-CM

## 2024-12-19 LAB — CMP (CANCER CENTER ONLY)
ALT: 25 U/L (ref 0–44)
AST: 22 U/L (ref 15–41)
Albumin: 4.4 g/dL (ref 3.5–5.0)
Alkaline Phosphatase: 87 U/L (ref 38–126)
Anion gap: 12 (ref 5–15)
BUN: 18 mg/dL (ref 8–23)
CO2: 24 mmol/L (ref 22–32)
Calcium: 9.8 mg/dL (ref 8.9–10.3)
Chloride: 103 mmol/L (ref 98–111)
Creatinine: 0.82 mg/dL (ref 0.44–1.00)
GFR, Estimated: >60 mL/min
Glucose, Bld: 184 mg/dL — ABNORMAL HIGH (ref 70–99)
Potassium: 4 mmol/L (ref 3.5–5.1)
Sodium: 139 mmol/L (ref 135–145)
Total Bilirubin: 0.3 mg/dL (ref 0.0–1.2)
Total Protein: 7.3 g/dL (ref 6.5–8.1)

## 2024-12-19 LAB — CBC WITH DIFFERENTIAL (CANCER CENTER ONLY)
Abs Immature Granulocytes: 0.01 10*3/uL (ref 0.00–0.07)
Basophils Absolute: 0 10*3/uL (ref 0.0–0.1)
Basophils Relative: 0 %
Eosinophils Absolute: 0.1 10*3/uL (ref 0.0–0.5)
Eosinophils Relative: 1 %
HCT: 39.2 % (ref 36.0–46.0)
Hemoglobin: 13.8 g/dL (ref 12.0–15.0)
Immature Granulocytes: 0 %
Lymphocytes Relative: 44 %
Lymphs Abs: 2 10*3/uL (ref 0.7–4.0)
MCH: 31.8 pg (ref 26.0–34.0)
MCHC: 35.2 g/dL (ref 30.0–36.0)
MCV: 90.3 fL (ref 80.0–100.0)
Monocytes Absolute: 0.4 10*3/uL (ref 0.1–1.0)
Monocytes Relative: 8 %
Neutro Abs: 2.1 10*3/uL (ref 1.7–7.7)
Neutrophils Relative %: 47 %
Platelet Count: 228 10*3/uL (ref 150–400)
RBC: 4.34 MIL/uL (ref 3.87–5.11)
RDW: 12 % (ref 11.5–15.5)
WBC Count: 4.6 10*3/uL (ref 4.0–10.5)
nRBC: 0 % (ref 0.0–0.2)

## 2024-12-19 MED ORDER — SODIUM CHLORIDE 0.9 % IV SOLN
80.0000 mg/m2 | Freq: Once | INTRAVENOUS | Status: AC
  Administered 2024-12-19: 156 mg via INTRAVENOUS
  Filled 2024-12-19: qty 26

## 2024-12-19 MED ORDER — ACETAMINOPHEN 325 MG PO TABS
650.0000 mg | ORAL_TABLET | Freq: Once | ORAL | Status: AC
  Administered 2024-12-19: 650 mg via ORAL
  Filled 2024-12-19: qty 2

## 2024-12-19 MED ORDER — SODIUM CHLORIDE 0.9 % IV SOLN: INTRAVENOUS | Status: DC

## 2024-12-19 MED ORDER — TRASTUZUMAB-ANNS CHEMO 150 MG IV SOLR
4.0000 mg/kg | Freq: Once | INTRAVENOUS | Status: AC
  Administered 2024-12-19: 336 mg via INTRAVENOUS
  Filled 2024-12-19: qty 16

## 2024-12-19 MED ORDER — FAMOTIDINE IN NACL 20-0.9 MG/50ML-% IV SOLN
20.0000 mg | Freq: Once | INTRAVENOUS | Status: AC
  Administered 2024-12-19: 20 mg via INTRAVENOUS
  Filled 2024-12-19: qty 50

## 2024-12-19 MED ORDER — DEXAMETHASONE SOD PHOSPHATE PF 10 MG/ML IJ SOLN
10.0000 mg | Freq: Once | INTRAMUSCULAR | Status: AC
  Administered 2024-12-19: 10 mg via INTRAVENOUS
  Filled 2024-12-19: qty 1

## 2024-12-19 MED ORDER — DIPHENHYDRAMINE HCL 50 MG/ML IJ SOLN
25.0000 mg | Freq: Once | INTRAMUSCULAR | Status: AC
  Administered 2024-12-19: 25 mg via INTRAVENOUS
  Filled 2024-12-19: qty 1

## 2024-12-19 NOTE — Assessment & Plan Note (Signed)
 pT1cN0M0, stage IA invasive ductal carcinoma, ER and PR negative, HER2 positive, grade 3 -Diagnosed in December 2025, found on screening mammogram. -Status post lumpectomy and sentinel lymph node biopsy, which showed a 1.4 cm invasive ductal carcinoma, negative lymph nodes and margins. -I recommend adjuvant chemotherapy with weekly paclitaxel for 12 weeks, and trastuzumab for 1 year

## 2024-12-22 ENCOUNTER — Encounter: Payer: Self-pay | Admitting: *Deleted

## 2024-12-26 ENCOUNTER — Inpatient Hospital Stay

## 2024-12-26 ENCOUNTER — Inpatient Hospital Stay: Admitting: Hematology

## 2024-12-28 ENCOUNTER — Ambulatory Visit: Attending: General Surgery | Admitting: Rehabilitation

## 2025-01-02 ENCOUNTER — Inpatient Hospital Stay: Admitting: Hematology

## 2025-01-02 ENCOUNTER — Inpatient Hospital Stay

## 2025-01-09 ENCOUNTER — Inpatient Hospital Stay: Admitting: Hematology

## 2025-01-09 ENCOUNTER — Inpatient Hospital Stay

## 2025-01-16 ENCOUNTER — Inpatient Hospital Stay: Admitting: Hematology

## 2025-01-16 ENCOUNTER — Inpatient Hospital Stay

## 2025-01-23 ENCOUNTER — Other Ambulatory Visit

## 2025-04-25 ENCOUNTER — Ambulatory Visit: Admitting: Physician Assistant

## 2025-10-26 ENCOUNTER — Encounter: Admitting: Physician Assistant
